# Patient Record
Sex: Male | Born: 1970 | Race: White | Hispanic: No | Marital: Married | Smoking: Current every day smoker
Health system: Southern US, Community
[De-identification: ages and names within clinical notes are randomized; demographics above are authoritative.]

## PROBLEM LIST (undated history)

## (undated) DIAGNOSIS — R569 Unspecified convulsions: Secondary | ICD-10-CM

## (undated) HISTORY — DX: Unspecified convulsions: R56.9

---

## 2018-03-30 ENCOUNTER — Emergency Department (HOSPITAL_COMMUNITY)
Admission: EM | Admit: 2018-03-30 | Discharge: 2018-03-31 | Disposition: A | Discharge status: Home / Self Care | Payer: Self-pay | Attending: Emergency Medicine | Admitting: Emergency Medicine

## 2018-03-30 ENCOUNTER — Other Ambulatory Visit: Payer: Self-pay

## 2018-03-30 DIAGNOSIS — Z79899 Other long term (current) drug therapy: Secondary | ICD-10-CM | POA: Insufficient documentation

## 2018-03-30 DIAGNOSIS — F1994 Other psychoactive substance use, unspecified with psychoactive substance-induced mood disorder: Secondary | ICD-10-CM | POA: Diagnosis present

## 2018-03-30 LAB — COMPREHENSIVE METABOLIC PANEL
ALBUMIN: 4.8 g/dL (ref 3.5–5.0)
ALT: 134 U/L — ABNORMAL HIGH (ref 17–63)
ANION GAP: 7 (ref 5–15)
AST: 97 U/L — ABNORMAL HIGH (ref 15–41)
Alkaline Phosphatase: 74 U/L (ref 38–126)
BUN: 13 mg/dL (ref 6–20)
CHLORIDE: 106 mmol/L (ref 101–111)
CO2: 25 mmol/L (ref 22–32)
Calcium: 9.5 mg/dL (ref 8.9–10.3)
Creatinine, Ser: 1.26 mg/dL — ABNORMAL HIGH (ref 0.61–1.24)
GFR calc Af Amer: >60 mL/min (ref >60)
GFR calc non Af Amer: >60 mL/min (ref >60)
GLUCOSE: 102 mg/dL — AB (ref 65–99)
Potassium: 4.7 mmol/L (ref 3.5–5.1)
SODIUM: 138 mmol/L (ref 135–145)
Total Bilirubin: 1 mg/dL (ref 0.3–1.2)
Total Protein: 8 g/dL (ref 6.5–8.1)

## 2018-03-30 LAB — CBC
HEMATOCRIT: 47 % (ref 39.0–52.0)
HEMOGLOBIN: 16.4 g/dL (ref 13.0–17.0)
MCH: 32.3 pg (ref 26.0–34.0)
MCHC: 34.9 g/dL (ref 30.0–36.0)
MCV: 92.7 fL (ref 78.0–100.0)
Platelets: 188 10*3/uL (ref 150–400)
RBC: 5.07 MIL/uL (ref 4.22–5.81)
RDW: 12.7 % (ref 11.5–15.5)
WBC: 8 10*3/uL (ref 4.0–10.5)

## 2018-03-30 LAB — ACETAMINOPHEN LEVEL

## 2018-03-30 LAB — ETHANOL: Alcohol, Ethyl (B): <10 mg/dL (ref <10)

## 2018-03-30 LAB — SALICYLATE LEVEL: Salicylate Lvl: <7 mg/dL (ref 2.8–30.0)

## 2018-03-30 LAB — RAPID URINE DRUG SCREEN, HOSP PERFORMED
Amphetamines: NOT DETECTED
BARBITURATES: NOT DETECTED
BENZODIAZEPINES: POSITIVE — AB
COCAINE: NOT DETECTED
Opiates: NOT DETECTED
TETRAHYDROCANNABINOL: POSITIVE — AB

## 2018-03-30 MED ORDER — ZIPRASIDONE MESYLATE 20 MG IM SOLR
10.0000 mg | Freq: Once | INTRAMUSCULAR | Status: DC
Start: 2018-03-30 — End: 2018-03-31

## 2018-03-30 MED ORDER — DIPHENHYDRAMINE HCL 50 MG/ML IJ SOLN
50.0000 mg | Freq: Once | INTRAMUSCULAR | Status: AC | PRN
  Administered 2018-03-31: 50 mg via INTRAMUSCULAR
  Filled 2018-03-30: qty 1

## 2018-03-30 MED ORDER — ZIPRASIDONE MESYLATE 20 MG IM SOLR
10.0000 mg | Freq: Once | INTRAMUSCULAR | Status: AC
  Administered 2018-03-30: 10 mg via INTRAMUSCULAR
  Filled 2018-03-30: qty 20

## 2018-03-30 MED ORDER — ZIPRASIDONE MESYLATE 20 MG IM SOLR
20.0000 mg | Freq: Once | INTRAMUSCULAR | Status: AC | PRN
  Administered 2018-03-31: 20 mg via INTRAMUSCULAR
  Filled 2018-03-30: qty 20

## 2018-03-30 NOTE — ED Provider Notes (Signed)
[facility]   CSN: [id number] Arrival date & time: 03/30/18  1245     History   Chief Complaint Chief Complaint  Patient presents with  . Suicidal    HPI Todd Lopez is a 47 y.o. male.  47 year old man presents under IVC by [facility] department after patient had some suicidal ideations.  Patient reportedly took 35 Xanax tablets but he admits only taken 3.  States that he took this for his anxiety and was not attempting to commit suicide.  He does report that he is more despondent due to his daughter dying of an overdose 8 months ago.  He denies any active ingestion of Tylenol or aspirin at this time.  No current use of alcohol products.  Patient has been very combative and is upset at law enforcement for bringing him here and he stated that he wants" to fuck them up".  Patient denies any formally diagnosed psychiatric problems.  No medication given prior to arrival.     No past medical history on file.  There are no active problems to display for this patient.         Home Medications    Prior to Admission medications   Not on File    Family History No family history on file.  Social History Social History   Tobacco Use  . Smoking status: Not on file  Substance Use Topics  . Alcohol use: Not on file  . Drug use: Not on file     Allergies   Patient has no allergy information on record.   Review of Systems Review of Systems  All other systems reviewed and are negative.    Physical Exam Updated Vital Signs BP (!) 191/157 (BP Location: Right Arm)   Pulse (!) 57   Temp 97.6 F (36.4 C) (Oral)   Resp 14   SpO2 100%   Physical Exam  Constitutional: He is oriented to person, place, and time. He appears well-developed and well-nourished.  Non-toxic appearance. No distress.  HENT:  Head: Normocephalic and atraumatic.  Eyes: Pupils are equal, round, and reactive to light. Conjunctivae, EOM and lids are  normal.  Neck: Normal range of motion. Neck supple. No tracheal deviation present. No thyroid mass present.  Cardiovascular: Normal rate, regular rhythm and normal heart sounds. Exam reveals no gallop.  No murmur heard. Pulmonary/Chest: Effort normal and breath sounds normal. No stridor. No respiratory distress. He has no decreased breath sounds. He has no wheezes. He has no rhonchi. He has no rales.  Abdominal: Soft. Normal appearance and bowel sounds are normal. He exhibits no distension. There is no tenderness. There is no rebound and no CVA tenderness.  Musculoskeletal: Normal range of motion. He exhibits no edema or tenderness.  Neurological: He is alert and oriented to person, place, and time. He has normal strength. No cranial nerve deficit or sensory deficit. GCS eye subscore is 4. GCS verbal subscore is 5. GCS motor subscore is 6.  Skin: Skin is warm and dry. No abrasion and no rash noted.  Psychiatric: His mood appears anxious. His speech is rapid and/or pressured. He is agitated, aggressive and hyperactive. He is not actively hallucinating. He expresses no suicidal plans and no homicidal plans.  Nursing note and vitals reviewed.    ED Treatments / Results  Labs (all labs ordered are listed, but only abnormal results are displayed) Labs Reviewed  COMPREHENSIVE METABOLIC PANEL  ETHANOL  SALICYLATE LEVEL  ACETAMINOPHEN LEVEL  CBC  RAPID URINE DRUG SCREEN, HOSP PERFORMED    EKG EKG Interpretation  Date/Time:  Thursday Mar 30 2018 13:48:57 EDT Ventricular Rate:  62 PR Interval:    QRS Duration: 108 QT Interval:  448 QTC Calculation: 455 R Axis:   62 Text Interpretation:  Sinus rhythm Borderline low voltage, extremity leads Confirmed by Lorre Nick ([id number]) on 03/30/2018 2:36:26 PM   Radiology No results found.  Procedures Procedures (including critical care time)  Medications Ordered in ED Medications  ziprasidone (GEODON) injection 10 mg (has no administration  in time range)     Initial Impression / Assessment and Plan / ED Course  I have reviewed the triage vital signs and the nursing notes.  Pertinent labs & imaging results that were available during my care of the patient were reviewed by me and considered in my medical decision making (see chart for details).     Patient required Geodon x2 for agitation.  He is more comfortable at this time.  He is not somnolent.  He is clear for psychiatric referral  Final Clinical Impressions(s) / ED Diagnoses   Final diagnoses:  None    ED Discharge Orders    None       Lorre Nick, MD 03/30/18 1440

## 2018-03-30 NOTE — ED Notes (Signed)
Bed: RESB Expected date:  Expected time:  Means of arrival:  Comments: [employer] SI xanax overdose 1240 ETA

## 2018-03-30 NOTE — BHH Counselor (Signed)
Clinician asked RN to inform her when the pt is roused/alert to engage in TTS assessment.    Redmond Pulling, MS, [facility], [facility] Triage Specialist [phone]
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
## 2018-03-30 NOTE — BHH Counselor (Signed)
Patient continues to be unable to assess. Patient was given Geodon earlier during the day. TTS writer attempted to assess patient but has been unable to due patient being sleep and unable to awaken. Patient will be assessed once patient is able to participate in the assessment.

## 2018-03-30 NOTE — ED Notes (Signed)
Patient pulled covers over his head upon arrival to unit and has been asleep since. He did respond when this writer attempted to speak with him. Regular, even, and unlabored respirations -- some snoring -- noted.

## 2018-03-30 NOTE — BHH Counselor (Signed)
Patient unable to be seen at this time. Patient was Geodon 2x 1308 and again at 1400.  TTS will attempt to see patient at a later time this day.

## 2018-03-30 NOTE — ED Notes (Signed)
Pt is screaming and pulling at his handcuffs, pt reports he is "refusing treatment and that is his right" RN attempted to explain to pt that he is IVC'd and we need an EKG to make sure his heart is functioning properly.  Pt screamed and cursed at staff and did not want them to do an EKG. RN attempting to reason with pt and pt allowed them to get an EKG at this time.

## 2018-03-31 ENCOUNTER — Encounter (HOSPITAL_COMMUNITY): Payer: Self-pay | Admitting: Emergency Medicine

## 2018-03-31 DIAGNOSIS — F1994 Other psychoactive substance use, unspecified with psychoactive substance-induced mood disorder: Secondary | ICD-10-CM | POA: Diagnosis present

## 2018-03-31 MED ORDER — STERILE WATER FOR INJECTION IJ SOLN
INTRAMUSCULAR | Status: AC
  Administered 2018-03-31: 10 mL
  Filled 2018-03-31: qty 10
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
## 2018-03-31 NOTE — ED Notes (Signed)
Pt had been angry because it was taking too long to discharge him in his opinion. Pt discharged home. Discharged instructions given to pt and he grabbed them out of this writer's hands without bothering to hear them discussed. Refused VS and refused to sign. All belongings returned to pt.  Continued to deny SI/HI, is not delusional and not responding to internal stimuli. Escorted pt to the ED exit.

## 2018-03-31 NOTE — Discharge Instructions (Signed)
For your behavioral health needs, you are advised to follow up with one of the following providers.  Contact them at your earliest opportunity to ask about enrolling in their program:       [facility]      [address]      [place], [place] [address]      [phone]      Provides psychiatry/medication management and therapy.  New patients are seen at their walk-in clinic.  Walk-in hours are Monday - Friday from 8:00 am - 12:00 pm, and from 1:00 pm - 3:00 pm.  Walk-in patients are seen on a first come, first served basis, so try to arrive as early as possible for the best chance of being seen the same day.  There is an initial fee of $22.50.       [facility]      [address]      [place], [place] [address]      [phone]      Provides psychiatry/medication management and group therapy.  New and returning patients are seen at their walk-in clinic.  Walk-in hours are Monday - Friday from 8:00 am - 3:00 pm.  Walk-in patients are seen on a first come, first served basis.  Try to arrive as early as possible for he best chance of being seen the same day.       [facility]      [address]      [place], [place] [address]      [phone]      Provides grief counseling.

## 2018-03-31 NOTE — BH Assessment (Signed)
BHH Assessment Progress Note Case was staffed with Sharma Covert DO who recommended IVC be rescinded and patient be discharged with OP resources.

## 2018-03-31 NOTE — ED Notes (Signed)
Pt woke up and immediately became agitated and angry, demanded to be let go, walked rapidly around the unit and sat down in front of the exit door and banged on it with his elbow. He would not de-escalate verbally and PRN IM medication was given. Pt initially refused, but with a show of support and additional conversation he allowed the shots to be given without hands on. He said that he sometimes get panic attacks with severe anxiety and increased heart rate because he lost his only daughter to an OD 8 months ago. He does not want to go inpatient. Pt denied any allergies to medication prior to administration of the medication.

## 2018-03-31 NOTE — BH Assessment (Signed)
BHH Assessment Progress Note This writer attempted to assess patient at 0800 hours unsuccessfully. Patient was noted to be agitated earlier and was sedated to assist with stabilization. Assess to be completed late this date.

## 2018-03-31 NOTE — BHH Counselor (Signed)
Per RN pt is still sleeping. TTS consult to be completed once pt is roused/alert.   Redmond Pullingreylese D Baudelio Karnes, MS, [facility], [facility] Triage Specialist [phone]

## 2018-03-31 NOTE — BHH Suicide Risk Assessment (Signed)
Suicide Risk Assessment  Discharge Assessment   [facility] Discharge Suicide Risk Assessment   Principal Problem: Substance induced mood disorder [facility]) Discharge Diagnoses:  Patient Active Problem List   Diagnosis Date Noted  . Substance induced mood disorder (HCC) [F19.94] 03/31/2018    Priority: High    Total Time spent with patient: 45 minutes  Musculoskeletal: Strength & Muscle Tone: within normal limits Gait & Station: normal Patient leans: N/A  Psychiatric Specialty Exam:   Blood pressure 110/67, pulse (!) 57, temperature 97.6 F (36.4 C), temperature source Oral, resp. rate 15, SpO2 100 %.There is no height or weight on file to calculate BMI.  General Appearance: Casual  Eye Contact::  Good  Speech:  Normal Rate409  Volume:  Normal  Mood:  Anxious, mild  Affect:  Congruent  Thought Process:  Coherent and Descriptions of Associations: Intact  Orientation:  Full (Time, Place, and Person)  Thought Content:  WDL and Logical  Suicidal Thoughts:  No  Homicidal Thoughts:  No  Memory:  Immediate;   Good Recent;   Good Remote;   Good  Judgement:  Fair  Insight:  Fair  Psychomotor Activity:  Normal  Concentration:  Good  Recall:  Good  Fund of Knowledge:Fair  Language: Good  Akathisia:  No  Handed:  Right  AIMS (if indicated):     Assets:  Housing Leisure Time Physical Health Resilience Social Support  Sleep:     Cognition: WNL  ADL's:  Intact   Mental Status Per Nursing Assessment::   On Admission:   47 yo male who presented to the ED with agitation after taking Xanax for anxiety.  Initially, they thought he took 35 tablets but he only took 3 Xanax to assist with his anxiety.  The Xanax was not his nor prescribed to him.  Recently moved to [facility], he has been upset about his daughter unintentionally overdosing 8 months ago and dying.  No suicidal/lhomicidal ideations, hallucinations, or withdrawal symptoms.  He was kept for over 24 hours and is no longer agitated.  His  wife came and has not safety concerns for him or anyone else.  Stable for discharge.  Demographic Factors:  Male and Caucasian  Loss Factors: NA  Historical Factors: NA  Risk Reduction Factors:   Sense of responsibility to family, Living with another person, especially a relative and Positive social support  Continued Clinical Symptoms:  Anxiety, mild  Cognitive Features That Contribute To Risk:  None    Suicide Risk:  Minimal: No identifiable suicidal ideation.  Patients presenting with no risk factors but with morbid ruminations; may be classified as minimal risk based on the severity of the depressive symptoms    Plan Of Care/Follow-up recommendations:  Activity:  as tolerated Diet:  heart healthy diet  Merland Holness, NP 03/31/2018, 3:37 PM

## 2018-03-31 NOTE — BH Assessment (Signed)
[facility]  Per Juanetta BeetsJacqueline Norman, DO, this pt does not require psychiatric hospitalization at this time.  Pt presents under IVC initiated by pt's wife, which Dr Sharma CovertNorman has rescinded.  [facility] with referral information for area providers of psychiatry and therapy.  Discharge instructions advise pt to follow up with [facility], with [facility], and [facility] [place].  Pt's nurse, Diane, has been notified.  Doylene Canninghomas Novelle Addair, MA Triage Specialist [phone]

## 2021-12-02 ENCOUNTER — Emergency Department (HOSPITAL_COMMUNITY): Payer: Self-pay

## 2021-12-02 ENCOUNTER — Emergency Department (HOSPITAL_COMMUNITY)
Admission: EM | Admit: 2021-12-02 | Discharge: 2021-12-02 | Disposition: A | Discharge status: Home / Self Care | Payer: Self-pay | Attending: Student | Admitting: Student

## 2021-12-02 ENCOUNTER — Encounter (HOSPITAL_COMMUNITY): Payer: Self-pay | Admitting: Emergency Medicine

## 2021-12-02 DIAGNOSIS — Z23 Encounter for immunization: Secondary | ICD-10-CM | POA: Insufficient documentation

## 2021-12-02 DIAGNOSIS — Z79899 Other long term (current) drug therapy: Secondary | ICD-10-CM | POA: Insufficient documentation

## 2021-12-02 DIAGNOSIS — W19XXXA Unspecified fall, initial encounter: Secondary | ICD-10-CM | POA: Insufficient documentation

## 2021-12-02 DIAGNOSIS — R569 Unspecified convulsions: Secondary | ICD-10-CM | POA: Insufficient documentation

## 2021-12-02 DIAGNOSIS — S0990XA Unspecified injury of head, initial encounter: Secondary | ICD-10-CM | POA: Insufficient documentation

## 2021-12-02 LAB — URINALYSIS, ROUTINE W REFLEX MICROSCOPIC
Bilirubin Urine: NEGATIVE
Glucose, UA: NEGATIVE mg/dL
Ketones, ur: NEGATIVE mg/dL
Leukocytes,Ua: NEGATIVE
Nitrite: NEGATIVE
Protein, ur: NEGATIVE mg/dL
Specific Gravity, Urine: >1.03 — ABNORMAL HIGH (ref 1.005–1.030)
pH: 5 (ref 5.0–8.0)

## 2021-12-02 LAB — CBC WITH DIFFERENTIAL/PLATELET
Abs Immature Granulocytes: 0.05 10*3/uL (ref 0.00–0.07)
Basophils Absolute: 0.1 10*3/uL (ref 0.0–0.1)
Basophils Relative: 0 %
Eosinophils Absolute: 0.1 10*3/uL (ref 0.0–0.5)
Eosinophils Relative: 1 %
HCT: 52 % (ref 39.0–52.0)
Hemoglobin: 17.9 g/dL — ABNORMAL HIGH (ref 13.0–17.0)
Immature Granulocytes: 0 %
Lymphocytes Relative: 17 %
Lymphs Abs: 1.9 10*3/uL (ref 0.7–4.0)
MCH: 32.4 pg (ref 26.0–34.0)
MCHC: 34.4 g/dL (ref 30.0–36.0)
MCV: 94.2 fL (ref 80.0–100.0)
Monocytes Absolute: 0.4 10*3/uL (ref 0.1–1.0)
Monocytes Relative: 3 %
Neutro Abs: 9 10*3/uL — ABNORMAL HIGH (ref 1.7–7.7)
Neutrophils Relative %: 79 %
Platelets: 235 10*3/uL (ref 150–400)
RBC: 5.52 MIL/uL (ref 4.22–5.81)
RDW: 11.9 % (ref 11.5–15.5)
WBC: 11.5 10*3/uL — ABNORMAL HIGH (ref 4.0–10.5)
nRBC: 0 % (ref 0.0–0.2)

## 2021-12-02 LAB — COMPREHENSIVE METABOLIC PANEL
ALT: 41 U/L (ref 0–44)
AST: 38 U/L (ref 15–41)
Albumin: 4.1 g/dL (ref 3.5–5.0)
Alkaline Phosphatase: 56 U/L (ref 38–126)
Anion gap: 9 (ref 5–15)
BUN: 13 mg/dL (ref 6–20)
CO2: 21 mmol/L — ABNORMAL LOW (ref 22–32)
Calcium: 9.3 mg/dL (ref 8.9–10.3)
Chloride: 108 mmol/L (ref 98–111)
Creatinine, Ser: 1.19 mg/dL (ref 0.61–1.24)
GFR, Estimated: >60 mL/min (ref >60)
Glucose, Bld: 108 mg/dL — ABNORMAL HIGH (ref 70–99)
Potassium: 4.3 mmol/L (ref 3.5–5.1)
Sodium: 138 mmol/L (ref 135–145)
Total Bilirubin: 1.2 mg/dL (ref 0.3–1.2)
Total Protein: 7.1 g/dL (ref 6.5–8.1)

## 2021-12-02 LAB — RAPID URINE DRUG SCREEN, HOSP PERFORMED
Amphetamines: NOT DETECTED
Barbiturates: NOT DETECTED
Benzodiazepines: NOT DETECTED
Cocaine: NOT DETECTED
Opiates: NOT DETECTED
Tetrahydrocannabinol: POSITIVE — AB

## 2021-12-02 LAB — URINALYSIS, MICROSCOPIC (REFLEX)

## 2021-12-02 LAB — ETHANOL: Alcohol, Ethyl (B): <10 mg/dL (ref <10)

## 2021-12-02 LAB — TSH: TSH: 2.55 u[IU]/mL (ref 0.350–4.500)

## 2021-12-02 MED ORDER — TETANUS-DIPHTH-ACELL PERTUSSIS 5-2.5-18.5 LF-MCG/0.5 IM SUSY
0.5000 mL | PREFILLED_SYRINGE | Freq: Once | INTRAMUSCULAR | Status: AC
  Administered 2021-12-02: 0.5 mL via INTRAMUSCULAR
  Filled 2021-12-02: qty 0.5

## 2021-12-02 MED ORDER — LEVETIRACETAM IN NACL 1000 MG/100ML IV SOLN
1000.0000 mg | Freq: Once | INTRAVENOUS | Status: AC
  Administered 2021-12-02: 1000 mg via INTRAVENOUS
  Filled 2021-12-02: qty 100

## 2021-12-02 MED ORDER — KETOROLAC TROMETHAMINE 30 MG/ML IJ SOLN
15.0000 mg | Freq: Once | INTRAMUSCULAR | Status: AC
  Administered 2021-12-02: 15 mg via INTRAVENOUS
  Filled 2021-12-02: qty 1

## 2021-12-02 MED ORDER — LEVETIRACETAM 750 MG PO TABS: 750.0000 mg | ORAL_TABLET | Freq: Two times a day (BID) | ORAL | 0 refills | Status: DC

## 2021-12-02 NOTE — ED Triage Notes (Signed)
Pt. Stated, Ive had hot flashes for years, today I woke up to get ready and tharts the last thing I remembered.  New onset seizure witnessed by wife .

## 2021-12-02 NOTE — ED Provider Triage Note (Signed)
Emergency Medicine Provider Triage Evaluation Note  Azir Muzyka , a 51 y.o. male  was evaluated in triage.  Pt complains of supposed seizure this morning around 630 to 7 AM.  Patient states he woke up, was in his usual state of health when he became very hot and the next and he remembers he was on the ground with his wife over top of him.  Patient denies any past seizure history, denies alcohol use.  Patient states he had period of confusion upon waking up, denies bowel or bladder incontinence.  Review of Systems  Positive: Weakness, loss of consciousness Negative: Chest pain, shortness of breath, nausea, vomiting  Physical Exam  BP 105/76 (BP Location: Left Arm)    Pulse (!) 50    Temp (!) 97.4 F (36.3 C) (Oral)    Resp 16    SpO2 96%  Gen:   Awake, no distress   Resp:  Normal effort  MSK:   Moves extremities without difficulty  Other:  Neuro exam shows no focal deficits.  Medical Decision Making  Medically screening exam initiated at 10:18 AM.  Appropriate orders placed.  Elver Stadler was informed that the remainder of the evaluation will be completed by another provider, this initial triage assessment does not replace that evaluation, and the importance of remaining in the ED until their evaluation is complete.     Al Decant, PA-C 12/02/21 1019
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
## 2021-12-02 NOTE — ED Provider Notes (Signed)
[facility] Provider [facility]   CSN: [id number] Arrival date & time: 12/02/21  [id number]     History  Chief Complaint  Patient presents with   Seizures   Head Injury   Fall    [address]Jdyn Marcene CorningJoseph Yaden is a 51 y.o. male.  51 y.o. male with a PMH of anxiety who presents to the ED from home via EMS for new onset 2-3 minute seizure with loss of consciousness, fall, head injury, and postictal period of 15 minutes. The patient's wife is present and acts as a historian for part of the history. This morning he awoke with an episode of "hot flashes" (feels SHOB, pale, sweaty) and walked to the bathroom to urinate. His wife heard him scream and heard a fall after which she found him seizing with eyes closed and muscles clenched.   The patient has a history of several years of "hot flashes" that occur intermittently and increase in frequency with increased stress level. The episodes last approximately 2-3 minutes with associated dyspnea, lightheadedness, nausea, vomiting, pale skin, and sweating. Afterwards, he feels confused and is unable to know himself or where he is for 30 seconds - 1 minute. He has never been treated for this before and attributes his symptoms to panic attacks. He states he last received treatment for anxiety 20 years ago and was temporarily on Xanax. He denies taking any medications aside from ibuprofen every other day for muscle aches from exercise. He uses meditation, exercise, and marijuana daily to manage stress. He denies alcohol use or other illicit drug use. He has a family history of epilepsy in his mother. He complains of soreness in all of his muscles, particularly his back, shoulders, ribs, and legs as well as a headache. He denies current CP, SOB, abdominal pain, or nausea.       Home Medications Prior to Admission medications   Medication Sig Start Date End Date Taking? Authorizing Provider  acetaminophen (TYLENOL) 500 MG tablet Take 1,000  mg by mouth every 6 (six) hours as needed for headache.   Yes [provider]  ibuprofen (ADVIL) 200 MG tablet Take 400-600 mg by mouth every 6 (six) hours as needed.   Yes [provider]  levETIRAcetam (KEPPRA) 750 MG tablet Take 1 tablet (750 mg total) by mouth 2 (two) times daily. 12/02/21 01/01/22 Yes Jeannie FendMurphy, Essie Lagunes A, PA-C      Allergies    Patient has no known allergies.    Review of Systems   Review of Systems  Constitutional:  Negative for fever.  Respiratory:  Negative for shortness of breath.   Gastrointestinal:  Negative for abdominal pain, constipation, diarrhea, nausea and vomiting.  Genitourinary:  Negative for dysuria.  Musculoskeletal:  Positive for myalgias.  Skin:  Positive for wound.  Allergic/Immunologic: Negative for immunocompromised state.  Neurological:  Positive for seizures and headaches.  Psychiatric/Behavioral:  Positive for confusion.    Physical Exam Updated Vital Signs BP 113/84    Pulse (!) 52    Temp (!) 97.4 F (36.3 C) (Oral)    Resp 11    SpO2 98%  Physical Exam Vitals and nursing note reviewed.  Constitutional:      Appearance: Normal appearance.  HENT:     Head: Normocephalic.      Comments: Right periorbital contusion, extraocular is intact, no crepitus    Nose: Nose normal.     Mouth/Throat:     Mouth: Mucous membranes are moist.  Eyes:  Extraocular Movements: Extraocular movements intact.     Pupils: Pupils are equal, round, and reactive to light.  Cardiovascular:     Rate and Rhythm: Normal rate and regular rhythm.     Heart sounds: Normal heart sounds.  Pulmonary:     Effort: Pulmonary effort is normal.     Breath sounds: Normal breath sounds.  Abdominal:     Palpations: Abdomen is soft.     Tenderness: There is no abdominal tenderness.  Musculoskeletal:        General: No swelling or deformity.     Cervical back: Normal range of motion and neck supple. Tenderness present. No bony tenderness.       Back:      Right lower leg: No edema.     Left lower leg: No edema.  Skin:    General: Skin is warm and dry.     Findings: No erythema or rash.  Neurological:     Mental Status: He is alert.     Sensory: No sensory deficit.     Motor: No weakness.     Comments: Reports date as 11/02/2019  Psychiatric:        Behavior: Behavior normal.    ED Results / Procedures / Treatments   Labs (all labs ordered are listed, but only abnormal results are displayed) Labs Reviewed  COMPREHENSIVE METABOLIC PANEL - Abnormal; Notable for the following components:      Result Value   CO2 21 (*)    Glucose, Bld 108 (*)    All other components within normal limits  CBC WITH DIFFERENTIAL/PLATELET - Abnormal; Notable for the following components:   WBC 11.5 (*)    Hemoglobin 17.9 (*)    Neutro Abs 9.0 (*)    All other components within normal limits  RAPID URINE DRUG SCREEN, HOSP PERFORMED - Abnormal; Notable for the following components:   Tetrahydrocannabinol POSITIVE (*)    All other components within normal limits  URINALYSIS, ROUTINE W REFLEX MICROSCOPIC - Abnormal; Notable for the following components:   APPearance HAZY (*)    Specific Gravity, Urine >1.030 (*)    Hgb urine dipstick TRACE (*)    All other components within normal limits  URINALYSIS, MICROSCOPIC (REFLEX) - Abnormal; Notable for the following components:   Bacteria, UA RARE (*)    All other components within normal limits  TSH  ETHANOL  CBG MONITORING, ED    EKG None  Radiology CT Head Wo Contrast  Result Date: 12/02/2021 CLINICAL DATA:  Trauma EXAM: CT HEAD WITHOUT CONTRAST CT CERVICAL SPINE WITHOUT CONTRAST TECHNIQUE: Multidetector CT imaging of the head and cervical spine was performed following the standard protocol without intravenous contrast. Multiplanar CT image reconstructions of the cervical spine were also generated. RADIATION DOSE REDUCTION: This exam was performed according to the departmental dose-optimization program  which includes automated exposure control, adjustment of the mA and/or kV according to patient size and/or use of iterative reconstruction technique. COMPARISON:  None. FINDINGS: CT HEAD FINDINGS Brain: No evidence of acute infarction, hemorrhage, hydrocephalus, extra-axial collection or mass lesion/mass effect. Vascular: No hyperdense vessel or unexpected calcification. Skull: Normal. Negative for fracture or focal lesion. Sinuses/Orbits: No acute finding. Other: None. CT CERVICAL SPINE FINDINGS Alignment: Normal. Skull base and vertebrae: No acute fracture. No primary bone lesion or focal pathologic process. Soft tissues and spinal canal: No prevertebral fluid or swelling. No visible canal hematoma. Disc levels:  No significant degenerative changes. Upper chest: Negative. Other: None. IMPRESSION: 1. No acute  intracranial abnormality. 2. No evidence of acute cervical spine fracture or traumatic malalignment. Electronically Signed   By: Allegra Lai M.D.   On: 12/02/2021 12:35   CT Cervical Spine Wo Contrast  Result Date: 12/02/2021 CLINICAL DATA:  Trauma EXAM: CT HEAD WITHOUT CONTRAST CT CERVICAL SPINE WITHOUT CONTRAST TECHNIQUE: Multidetector CT imaging of the head and cervical spine was performed following the standard protocol without intravenous contrast. Multiplanar CT image reconstructions of the cervical spine were also generated. RADIATION DOSE REDUCTION: This exam was performed according to the departmental dose-optimization program which includes automated exposure control, adjustment of the mA and/or kV according to patient size and/or use of iterative reconstruction technique. COMPARISON:  None. FINDINGS: CT HEAD FINDINGS Brain: No evidence of acute infarction, hemorrhage, hydrocephalus, extra-axial collection or mass lesion/mass effect. Vascular: No hyperdense vessel or unexpected calcification. Skull: Normal. Negative for fracture or focal lesion. Sinuses/Orbits: No acute finding. Other: None.  CT CERVICAL SPINE FINDINGS Alignment: Normal. Skull base and vertebrae: No acute fracture. No primary bone lesion or focal pathologic process. Soft tissues and spinal canal: No prevertebral fluid or swelling. No visible canal hematoma. Disc levels:  No significant degenerative changes. Upper chest: Negative. Other: None. IMPRESSION: 1. No acute intracranial abnormality. 2. No evidence of acute cervical spine fracture or traumatic malalignment. Electronically Signed   By: Allegra Lai M.D.   On: 12/02/2021 12:35    Procedures Procedures    Medications Ordered in ED Medications  Tdap (BOOSTRIX) injection 0.5 mL (has no administration in time range)  levETIRAcetam (KEPPRA) IVPB 1000 mg/100 mL premix (has no administration in time range)  ketorolac (TORADOL) 30 MG/ML injection 15 mg (has no administration in time range)    ED Course/ Medical Decision Making/ A&P                           Medical Decision Making Amount and/or Complexity of Data Reviewed Labs: ordered. Radiology: ordered.  Risk Prescription drug management.   This patient presents to the ED for concern of seizure-like activity today, this involves an extensive number of treatment options, and is a complaint that carries with it a high risk of complications and morbidity.  The differential diagnosis includes but not limited to electrolyte disturbance, withdrawal, brain mass, brain injury   Co morbidities that complicate the patient evaluation  Sub induced mood disorder   Additional history obtained:  Additional history obtained from wife at bedside who reports whole body shaking episode followed by period of confusion External records from outside source obtained and reviewed including prior record on file from 2019 related to Xanax use, denies use at this time   Lab Tests:  I Ordered, and personally interpreted labs.  The pertinent results include: UTI positive for marijuana, TSH normal, CBC with nonspecific  leukocytosis at 11.5.  CMP without significant electrolyte derangement, glucose 108.  Urinalysis negative for infection.   Imaging Studies ordered:  I ordered imaging studies including CT head and C-spine I independently visualized and interpreted imaging which showed no acute intracranial abnormality, no C-spine fracture I agree with the radiologist interpretation   Cardiac Monitoring:  The patient was maintained on a cardiac monitor.  I personally viewed and interpreted the cardiac monitored which showed an underlying rhythm of: Sinus bradycardia   Medicines ordered and prescription drug management:  I ordered medication including Keppra load for seizure-like episode Reevaluation of the patient after these medicines showed that the patient  no seizure activity while  in the department, discharged load given. I have reviewed the patients home medicines and have made adjustments as needed   Test Considered:  EEG, can be completed outpatient  Consultations Obtained:  I requested consultation with the neuro hospitalist, Dr. Thomasena Edis,  and discussed lab and imaging findings as well as pertinent plan - they recommend: Keppra load and discharged on Keppra 750 mg twice daily, follow-up with neurology outpatient   Problem List / ED Course:  51 year old male brought in by EMS from home with seizure-like activity as witnessed by wife as above.  Upon arrival in the emergency room, patient was a little disoriented as to the date, this is improved with time.  He is found to have mild right periorbital ecchymosis without crepitus, extraocular is intact.  CT of the head and C-spine are negative for acute injury or findings to explain his event today.  Does report marijuana use, discussed this can lower seizure threshold.  Electrolytes otherwise normal and work-up is unremarkable. Discussed with Dr. Thomasena Edis with neurology, discussed hot flash episodes as well as today's witnessed event, reports this  could be left temporal lobe related and recommends loading with Keppra and discharged with same with neuro follow-up   Reevaluation:  After the interventions noted above, I reevaluated the patient and found that they have :improved  Dispostion:  After consideration of the diagnostic results and the patients response to treatment, I feel that the patent would benefit from initiation of Keppra, follow-up with neurology.  Given return to ER precautions, advised not to drive, work from heights or soak in a tub/pool until further evaluation and clearance with neurology.          Final Clinical Impression(s) / ED Diagnoses Final diagnoses:  Seizure [facility])    Rx / DC Orders ED Discharge Orders          Ordered    levETIRAcetam (KEPPRA) 750 MG tablet  2 times daily        12/02/21 1309              Alden Hipp 12/02/21 1337    Glendora Score, MD 12/02/21 [phone]

## 2021-12-02 NOTE — ED Triage Notes (Signed)
EMS stated, wife stated he had a new seizure onset , pt fell and hit his head. He has had hot flashes before it happened. Pt was postictal for 15 min.

## 2021-12-02 NOTE — Discharge Instructions (Signed)
Take Keppra as prescribed. Follow-up with neurology, referral given. Return to ER for new or worsening symptoms  Do not drive, operate machinery, work from a ladder or height, soak in a tub or some in a pool without direct observation, until cleared by neurology.

## 2021-12-03 ENCOUNTER — Encounter: Payer: Self-pay | Admitting: Neurology

## 2021-12-11 ENCOUNTER — Ambulatory Visit (INDEPENDENT_AMBULATORY_CARE_PROVIDER_SITE_OTHER): Payer: Self-pay | Admitting: Neurology

## 2021-12-11 ENCOUNTER — Encounter: Payer: Self-pay | Admitting: Neurology

## 2021-12-11 ENCOUNTER — Other Ambulatory Visit: Payer: Self-pay

## 2021-12-11 VITALS — BP 115/77 | HR 74 | Resp 18 | Ht 70.0 in | Wt 206.0 lb

## 2021-12-11 DIAGNOSIS — R569 Unspecified convulsions: Secondary | ICD-10-CM
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
## 2021-12-14 ENCOUNTER — Other Ambulatory Visit: Payer: Self-pay

## 2021-12-14 ENCOUNTER — Ambulatory Visit (INDEPENDENT_AMBULATORY_CARE_PROVIDER_SITE_OTHER): Payer: Self-pay | Admitting: Neurology

## 2021-12-14 DIAGNOSIS — R569 Unspecified convulsions: Secondary | ICD-10-CM

## 2021-12-14 MED ORDER — LAMOTRIGINE 100 MG PO TABS: 100.0000 mg | ORAL_TABLET | Freq: Two times a day (BID) | ORAL | 4 refills | Status: DC

## 2021-12-14 MED ORDER — LAMOTRIGINE 25 MG PO TABS: ORAL_TABLET | ORAL | 0 refills | Status: DC

## 2021-12-15 NOTE — Procedures (Signed)
ELECTROENCEPHALOGRAM REPORT  Date of Study: 12/14/2021  Patient's Name: Todd Lopez MRN: [id number] Date of Birth: 1971-07-24  Referring Provider: Dr. Patrcia Dolly  Clinical History: This is a 51 year old man with new onset seizure, with recurrent episodes of "hot flashes" with associated confusion. EEG for classification.  Medications: KEPPRA 750 MG tablet TYLENOL 500 MG tablet ADVIL 200 MG tablet   Technical Summary: A multichannel digital 1-hour EEG recording measured by the international 10-20 system with electrodes applied with paste and impedances below 5000 ohms performed in our laboratory with EKG monitoring in an awake and asleep patient.  Hyperventilation was not performed. Photic stimulation was performed.  The digital EEG was referentially recorded, reformatted, and digitally filtered in a variety of bipolar and referential montages for optimal display.    Description: The patient is awake and asleep during the recording.  During maximal wakefulness, there is a symmetric, medium voltage 9 Hz posterior dominant rhythm that attenuates with eye opening.  There is occasional focal 4-5 Hz theta slowing over the right frontotemporal region. During drowsiness and sleep, there is an increase in theta slowing of the background.  Vertex waves and symmetric sleep spindles were seen.  Photic stimulation did not elicit any abnormalities.  There were no epileptiform discharges or electrographic seizures seen.    EKG lead showed sinus bradycardia at 48 bpm.  Impression: This 1-hour awake and asleep EEG is abnormal due to occasional focal slowing over the right frontotemporal region.  Clinical Correlation of the above findings indicates focal cerebral dysfunction over the right frontotemporal region suggestive of underlying structural or physiologic abnormality. The absence of epileptiform discharges does not exclude a clinical diagnosis of epilepsy. Clinical correlation is  advised.   Patrcia Dolly, M.D.

## 2022-01-03 ENCOUNTER — Other Ambulatory Visit: Payer: Self-pay | Admitting: Neurology

## 2022-01-03 ENCOUNTER — Other Ambulatory Visit: Payer: Self-pay

## 2022-01-03 ENCOUNTER — Ambulatory Visit
Admission: RE | Admit: 2022-01-03 | Discharge: 2022-01-03 | Disposition: A | Discharge status: Home / Self Care | Payer: No Typology Code available for payment source | Source: Ambulatory Visit | Attending: Neurology | Admitting: Neurology

## 2022-01-03 DIAGNOSIS — R569 Unspecified convulsions: Secondary | ICD-10-CM

## 2022-01-03 MED ORDER — GADOBENATE DIMEGLUMINE 529 MG/ML IV SOLN: 19.0000 mL | Freq: Once | INTRAVENOUS | Status: DC | PRN

## 2022-01-05 ENCOUNTER — Telehealth: Payer: Self-pay | Admitting: Neurology

## 2022-01-05 NOTE — Telephone Encounter (Signed)
Left VM to discuss test results.

## 2022-02-10 ENCOUNTER — Telehealth: Payer: Self-pay | Admitting: Neurology

## 2022-02-10 NOTE — Telephone Encounter (Signed)
Pt's wife called in stating the seizure medication is working well, but he is still having panic attacks. He is having a little more family stress in his life. He had a panic attack in a store today. They are aware Dr. Delice Lesch is out until Monday. Requests a call to the patient.  ?

## 2022-02-10 NOTE — Telephone Encounter (Signed)
Call would not go thru at 3:52  ?

## 2022-02-11 NOTE — Telephone Encounter (Signed)
Mychart message sent.

## 2022-03-08 ENCOUNTER — Encounter: Payer: Self-pay | Admitting: Neurology

## 2022-03-08 ENCOUNTER — Telehealth: Payer: Self-pay | Admitting: Neurology

## 2022-03-08 ENCOUNTER — Ambulatory Visit (INDEPENDENT_AMBULATORY_CARE_PROVIDER_SITE_OTHER): Payer: Self-pay | Admitting: Neurology

## 2022-03-08 VITALS — BP 115/73 | HR 64 | Ht 71.0 in | Wt 204.6 lb

## 2022-03-08 DIAGNOSIS — R569 Unspecified convulsions: Secondary | ICD-10-CM

## 2022-03-08 NOTE — Telephone Encounter (Signed)
Patients friend Lowella Bandy called and stated they need the notes from Joes last appt faxed to [facility], [place].  Fax # [phone]. ?
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
## 2022-03-09 NOTE — Telephone Encounter (Signed)
Office notes faxed to [facility]  ?

## 2022-05-10 ENCOUNTER — Ambulatory Visit (INDEPENDENT_AMBULATORY_CARE_PROVIDER_SITE_OTHER): Payer: Self-pay | Admitting: Neurology

## 2022-05-10 ENCOUNTER — Encounter: Payer: Self-pay | Admitting: Neurology

## 2022-05-10 VITALS — BP 108/72 | HR 65 | Ht 70.0 in | Wt 207.0 lb

## 2022-05-10 DIAGNOSIS — R569 Unspecified convulsions: Secondary | ICD-10-CM

## 2022-05-10 DIAGNOSIS — F410 Panic disorder [episodic paroxysmal anxiety] without agoraphobia: Secondary | ICD-10-CM
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
## 2022-05-21 ENCOUNTER — Ambulatory Visit (INDEPENDENT_AMBULATORY_CARE_PROVIDER_SITE_OTHER): Payer: Self-pay | Admitting: Neurology

## 2022-05-21 DIAGNOSIS — F410 Panic disorder [episodic paroxysmal anxiety] without agoraphobia: Secondary | ICD-10-CM

## 2022-05-21 DIAGNOSIS — R569 Unspecified convulsions: Secondary | ICD-10-CM

## 2022-05-24 ENCOUNTER — Telehealth: Payer: Self-pay | Admitting: Neurology

## 2022-05-24 NOTE — Telephone Encounter (Signed)
Spoke with pt wife informed her that Dr Karel Jarvis had not yet received the EEG study for review. In the meantime, Dr Karel Jarvis  would like to increase his Lamotrigine to 200mg  BID.increase to 2 tabs BID. If he is running out of 100mg  tablets, pls let him know we are sending in a prescription for higher dose 200mg : take 1 tab BID. Pt wife will call when they are getting low on 100mg  tablets

## 2022-05-24 NOTE — Telephone Encounter (Signed)
Patients wife called and stated Todd Lopez has the cap on all weekend.  He had a grand mall seizure yesterday morning.  He was taking his seizure med the whole time.  She wanted to let Dr know and she if she wants to up his medication. He is getting the cap off today and didn't know if she needs to schedule a follow up.

## 2022-05-24 NOTE — Telephone Encounter (Signed)
I have not yet received the EEG study for review. In the meantime, yes I would like to increase his Lamotrigine to 200mg  BID. Does he still have a lot of the 100mg  tabs? If yes, increase to 2 tabs BID. If he is running out of 100mg  tablets, pls let him know we are sending in a prescription for higher dose 200mg : take 1 tab BID. Pls send Rx if needed. Thanks

## 2022-05-24 NOTE — Telephone Encounter (Signed)
Pt c/o: seizure Missed medications?  No. Sleep deprived?  Yes.   Hard to sleep with eeg monitor on  Alcohol intake?  No. Increased stress? No. Any change in medication color or shape? No. Any triggers? No trigger,  Back to their usual baseline self?  Yes.  . If no, advise go to ER Current medications prescribed by Dr. Karel Jarvis: lamotrigine  1 tablet (100 mg total) by mouth 2 (two) times daily  Stopped the xanax in the morning, to see how he was without it, Friday with out it a few episodes sat had a 6 episodes and Sunday was the big one he was going to go to the bathroom but he went back on the bed kicked everything over, seizure last 2 to 3 mins confusion was about 10 mins.   He is no longer taken Prozac he is taken 20 mg citalopram

## 2022-05-27 ENCOUNTER — Telehealth: Payer: Self-pay | Admitting: Neurology

## 2022-05-27 NOTE — Telephone Encounter (Signed)
Spoke to patient and the to his wife Joni Reining about aEEG results. Discussed that the events where he thought of the past with palpitations, SOB did show EEG changes with right temporal seizures seen. His nocturnal seizure appears to have a left temporal onset. Continue Lamotrigine 200mg  BID, she will let know when he needs a refill for 200mg  tab to take 1 tablet BID. Discussed that Xanax should not be taken on a daily basis, and if he would need it daily, would recommend Clobazam instead as a daily seizure preventative medication. He then reported that he is not needing the Xanax daily now, he takes it once every other day, he would like something as a rescue when he has a meeting or something stressful. Discussed that as long as he is not taking Xanax daily due to potential for dependence and tolerance, which they understand. He is also on citalopram now which has helped with the agitation, it has gone away.

## 2022-06-02 NOTE — Procedures (Signed)
Patient's Name: Todd Lopez MRN: [id number] Date of Birth: [birth year]   Ordering Provider: Patrcia Dolly, M.D. DX CODE(s): R56.9 EXAM DURATION: 69 hours   CLINICAL HISTORY: This is a 51 year old man with stereotyped episodes of feeling anxious, warm, palpitations, that he would be amnestic of. One of these progressed to a convulsion in February 2023. He continues to report recurrent anxiety/panic attacks. EEG for classification.    MEDICATION(s): Lamotrigine, Citalopram, Alprazolam (held during study)   TECHNICAL DESCRIPTION: Long-Term EEG with Video was monitored intermittently by a qualified EEG technologist for the entirety of the recording; quality check-ins were performed at a minimum of every two hours, checking, and documenting real-time data and video to assure the integrity and quality of the recording (e.g., camera position, electrode integrity and impedance), and identify the need for maintenance. For intermittent monitoring, an EEG Technologist monitored no more than 12 patients concurrently. Video was being recorded at least 80% of the time during the study duration, unless otherwise noted in an Exception Statement.   At the end of the recording, the EEG Technologist generates a technical description, which is the EEG Technologist's written documentation of the reviewed video-EEG data, including technical interventions and these elements: reviewing raw EEG/VEEG data and events and automated detection as well as patient pushbutton event activations; and annotating, editing, and archiving EEG/VEEG data for review by the physician or other qualified healthcare professional. For review, the Video EEG recording can be visualized in all standard types of montages, 16 channels and greater, and playbacks include digital high frequency filters previously noted. The Video EEG has been notated with patient typical symptom events at the direction of the patient by depressing a push button mounted on a  waist worn [employer] EEG recording device. Digital spike and seizure detection software was used to identify potential abnormalities in the EEG, and alerts were reviewed and annotated by the technologist in the [employer] EEG Review software. Video EEG and report are notated with events that were determined to be of significance by the digital analysis software showing spike and seizure detections.   SET-UP TECH: Eleanora Neighbor   RECORDING SET-UP DATE: 05/21/2022 11:35AM RECORDING TAKE-DOWN DATE: 05/24/2022 9:07AM     SPIKE AND SEIZURE ANALYSIS AND REVIEW: Spike and seizure detection software alerts have been reviewed by a Registered EEG Technologist.   PUSH BUTTON EVENTS: A push button or notation was made 8 times. Patient log was reviewed with the patient at disconnect with the intent to reconcile events. 2 button presses were accidental, or monitor tech driven (Resets)   DESCRIPTION OF RECORDING: During maximal wakefulness, the background activity consisted of a symmetric 10 Hz posterior dominant rhythm that was reactive to eye opening and eye closure. There is occasional runs of focal 4-5 Hz theta slowing over the right frontotemporal region, at time sharply contoured. There were no epileptiform discharges seen.   During the recording, the patient progresses through wakefulness, drowsiness, and sleep. Vertex waves and sleep spindles were seen. Similar occasional focal right frontotemporal theta slowing is seen. Again there were no epileptiform discharges seen.   EKG lead was normal.    PUSH BUTTON EVENTS: On 7/21 at 1818 alert is pressed 1 time. He was watching [employer] on TV and started thinking about the past, fast thought, had weird small, fast heartbeat, hard to breathe. He is sitting on the couch and seen taking a few heavy breaths. Electrographically at 1817 hours, rhythmic 4-5 Hz activity is seen over the right frontotemporal region evolving  in frequency and amplitude, lasting 22  seconds. After electrographic seizure ends, he looks to his wife and motions with his right hand, she stands and pushes event button.  On 7/21 at 2118 hours, no alert pressed. He lists a small one on diary, it made him start yawning, heart beating hard, head throbbing, dizzy for 30 seconds while watching trauma on TV that brought up emotions. He is sitting on couch staring straight ahead. Electrographically, rhythmic 4-5 Hz theta activity is seen over the right frontotemporal region evolving in frequency and amplitude, lasting 15 seconds.  On 7/22 at 0828 hours, no alert pressed. Diary entry lists a small quick one. He is sitting on the couch, closes eyes and lays head back. Electrographically, there were no EEG or EKG changes seen.  On 7/22 at 1122 hours, alert pressed 1 time, listed in diary as thought about neighbor mowing their yard. He is standing in the kitchen, walks out of range, no clinical changes seen. Electrographically, at 1120 hours, rhythmic 4-5 Hz theta activity is seen over the right frontotemporal region evolving in frequency and amplitude, lasting 20 seconds. He pushes the event button around 45 seconds after seizure cessation.  On 7/22 at 1411 hours, no alert pressed. Diary entry listed a small episode about 2 minutes. Patient sitting on couch watching TV, no clinical changes seen. Electrographically, there were no EEG or EKG changes seen.  On 7/22 at 1428 hours, no alert pressed. He listed a small one. He is sitting on couch, no clinical changes seen. Electrographically, there were no EEG or EKG changes seen.   On 7/23 at 0547 hours, no alert is pressed. He is asleep and arouses, turns to his left side with back to camera. He moans several times and says "oh God. This lasts for 2 minutes and 30 seconds then he goes back to sleep. Electrographically, sleep architecture is seen followed by arousal at 0547 with muscle artifact obscuring EEG for 23 seconds, then rhythmic 4-5 Hz theta  activity is seen over the left temporal region with spread to the right frontal and vertex regions evolving in frequency for 75 seconds followed by delta slowing over the left temporal region.  On 7/23 at 0658 hours, no alert pressed. Diary entry listed at 0717 hours indicates grand mal seizure lasting 3-5 minutes, he was confused for 20 minutes after. He is sitting on the bed and moans like he is trying to catch his breath. He touches his chin with his left hand, head turns to the right with right leg extended, then clonic movement of the right arm and leg (laying on left side). Electrographically, there is rhythmic 3-4 Hz activity over the left anterior temporal region intermixed with spike discharges in the left hemisphere and bilateral anterior spike and wave discharges. EEG then becomes obscured by muscle artifact for 2 minutes. This is followed by delta slowing over the left temporal region.  On 7/23 at 1016 hours, alert pressed 1 time. Diary entry listed as feeling like he is falling with heart pounding fast for 5 minutes. He is sitting on the couch with labored breathing, says "Oh God," lays back on couch. Electrographically, rhythmic 6-7 Hz activity is seen over the left hemisphere for 15 seconds then spreads to the right hemisphere for 20 seconds before return to baseline.  On 7/23 at 1027 hours, alert pressed 1 time. Diary entry listed as sweating real bad with this smaller one, has to pee. He is not on video. Electrographically, rhythmic 6-7  Hz theta activity is seen over the left hemisphere for 20 seconds then spreads to the right hemisphere for another 20 seconds before return to baseline.    IMPRESSION: This 69-hour ambulatory video EEG is abnormal due to the presence of: Occasional focal slowing over the right temporal region There were 3 focal seizures as described above arising from the right temporal region There is 4 focal seizures that appeared to arise from the left temporal region,  one of these progressed to a generalized tonic-clonic seizure.     CLINICAL CORRELATION of the above findings indicates focal cerebral dysfunction over the right temporal region suggestive of underlying structural or physiologic abnormality. There is a definite tendency for seizures to arise from the bilateral temporal regions. Focal seizures were seen independently from right and left temporal regions, the generalized convulsion captured appears to have left temporal onset. If further clinical questions remain, inpatient video EEG monitoring may be helpful.   Patrcia Dolly, M.D.

## 2022-06-08 ENCOUNTER — Ambulatory Visit: Payer: Self-pay | Admitting: Neurology

## 2022-06-14 ENCOUNTER — Other Ambulatory Visit: Payer: Self-pay

## 2022-06-14 ENCOUNTER — Telehealth: Payer: Self-pay | Admitting: Neurology

## 2022-06-14 DIAGNOSIS — R569 Unspecified convulsions: Secondary | ICD-10-CM

## 2022-06-14 MED ORDER — LAMOTRIGINE 200 MG PO TABS: 200.0000 mg | ORAL_TABLET | Freq: Two times a day (BID) | ORAL | 1 refills | Status: DC

## 2022-06-14 NOTE — Telephone Encounter (Signed)
Prescription sent patients wife called to be made aware

## 2022-06-14 NOTE — Telephone Encounter (Signed)
Pt's wife called in stating she was told to let Dr. Karel Jarvis know when the pt was out of the 100 mg Lamotrigine so Dr. Karel Jarvis can switch him to the 200 mg tablets. Send to [facility].

## 2022-07-02 ENCOUNTER — Telehealth: Payer: Self-pay | Admitting: Neurology

## 2022-07-02 NOTE — Telephone Encounter (Signed)
The following message was left with [employer] on 07/02/22 at 1:02 PM.  Caller states her husband is still having small seizures even after upping his dosage on his medication. Caller states he had 9 small seizures on Wednesday.

## 2022-07-02 NOTE — Telephone Encounter (Signed)
Close encounter 

## 2022-07-06 ENCOUNTER — Other Ambulatory Visit: Payer: Self-pay

## 2022-07-06 DIAGNOSIS — F1994 Other psychoactive substance use, unspecified with psychoactive substance-induced mood disorder: Secondary | ICD-10-CM

## 2022-07-06 NOTE — Telephone Encounter (Signed)
Called and advised, ordered level

## 2022-07-06 NOTE — Telephone Encounter (Signed)
Pls have them check Lamictal level so we can see how much is in his system, thanks

## 2022-07-06 NOTE — Telephone Encounter (Signed)
All back to normal, except today, small seizure this am.  Xanax 0.5mg  qday, when she didn't give it to him. But had a seizure this am.  Doesn't think it is withdrawal  No sleep deprived No alcohol intake Always new stress Baseline back to himself Meds: Lamotgrine 200mg  bid            Celexa 20mg  1 tab poqd.

## 2023-02-21 IMAGING — MR MR HEAD W/O CM
9 of 10 series · 39 of 48 positions shown · non-contrast
Comparison: Noncontrast head CT 12/02/2021.

CLINICAL DATA: New onset seizure. Additional history provided by
scanning technologist: Patient reports history of possible seizures
for 15 years.

EXAM:
MRI HEAD WITHOUT CONTRAST
TECHNIQUE: Multiplanar, multiecho pulse sequences of the brain and surrounding
structures were obtained without intravenous contrast.

[Series 2: T1 · sagittal · 5.0mm · 0.45mm/px · 2 of 27 slices shown]
[im 1/27]
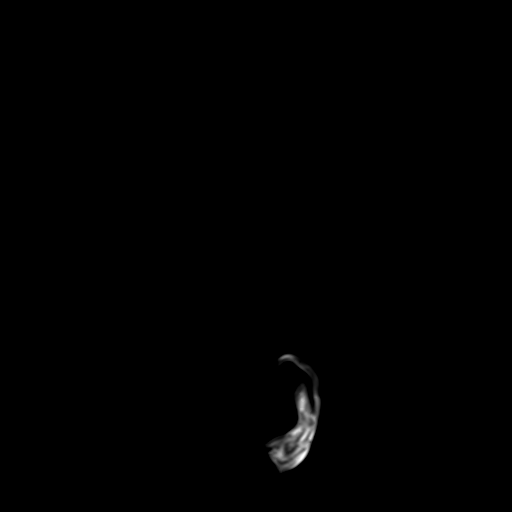
[im 27/27]
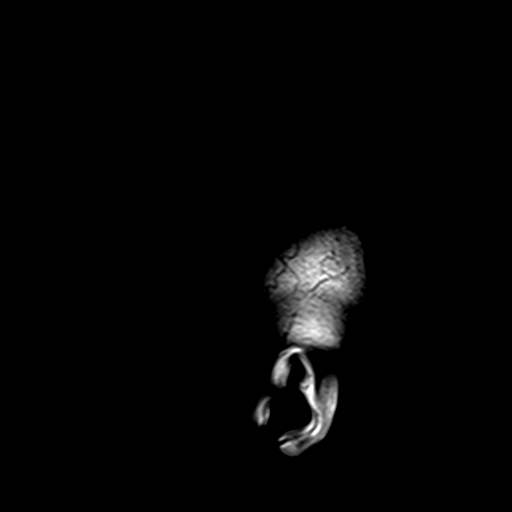

[Series 3: DWI · axial · 3.0mm · 1.80mm/px · z∈[-49,+106]mm · 11 of 105 slices shown (1 of 2)]
[im 1/105]
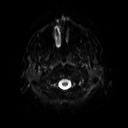
[im 11/105]
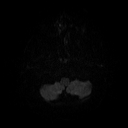
[im 21/105]
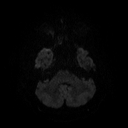
[im 32/105]
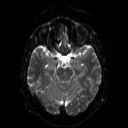
[im 42/105]
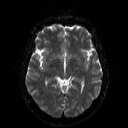
[im 53/105]
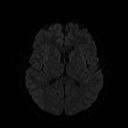
[im 63/105]
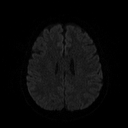
[im 73/105]
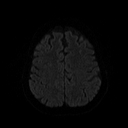
[im 84/105]
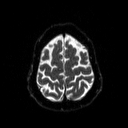
[im 94/105]
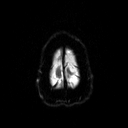
[im 105/105]
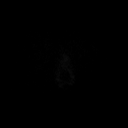

[Series 4: DWI · axial · 3.0mm · 1.80mm/px · z∈[-49,+106]mm · 6 of 53 slices shown (2 of 2)]
[im 1/53]
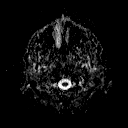
[im 11/53]
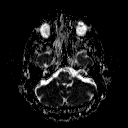
[im 21/53]
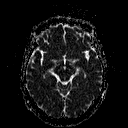
[im 32/53]
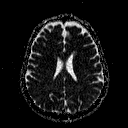
[im 42/53]
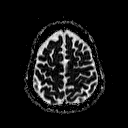
[im 53/53]
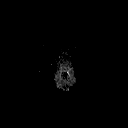

[Series 5: T2 · axial · 5.0mm · 0.51mm/px · z∈[-52,+107]mm · 3 of 25 slices shown (1 of 3)]
[im 1/25]
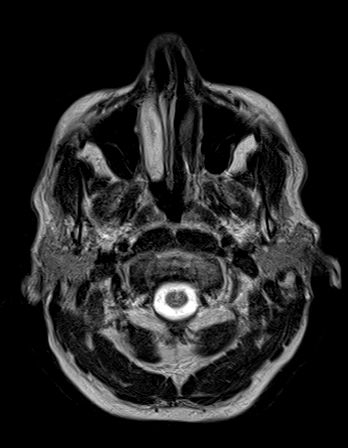
[im 13/25]
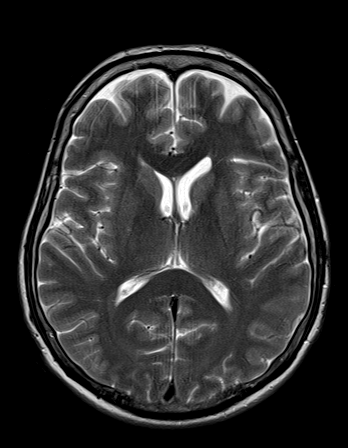
[im 25/25]
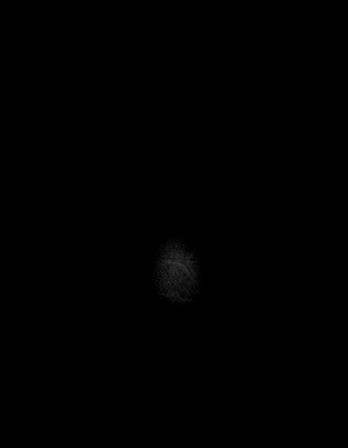

[Series 6: FLAIR · axial · 3.0mm · 0.47mm/px · z∈[-66,+96]mm · 4 of 36 slices shown (1 of 2)]
[im 1/36]
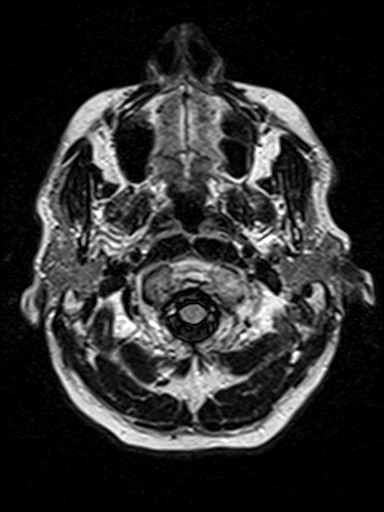
[im 12/36]
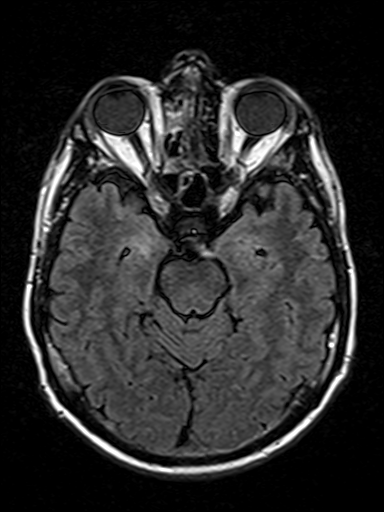
[im 24/36]
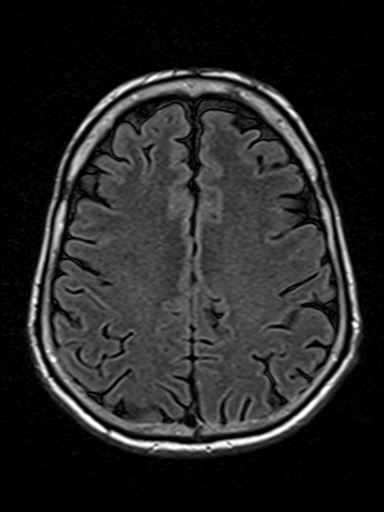
[im 36/36]
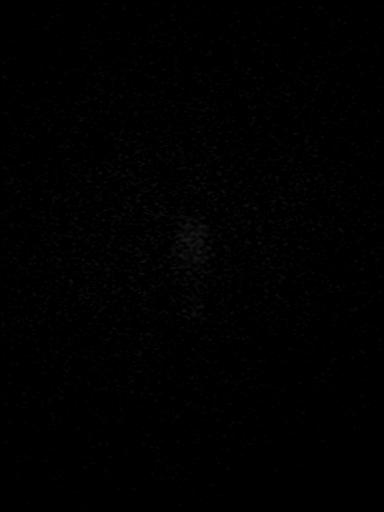

[Series 8: swi_images · axial · 4.0mm · 0.90mm/px · z∈[-63,+92]mm · 4 of 40 slices shown]
[im 1/40]
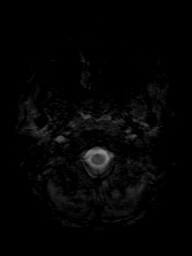
[im 14/40]
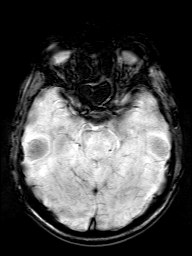
[im 27/40]
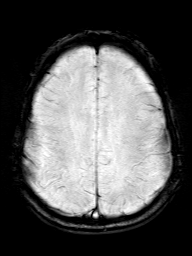
[im 40/40]
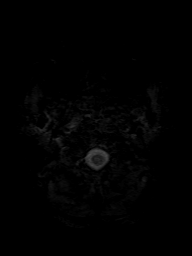

[Series 10: T2 · coronal · 3.0mm · 0.23mm/px · 3 of 28 slices shown (2 of 3)]
[im 1/28]
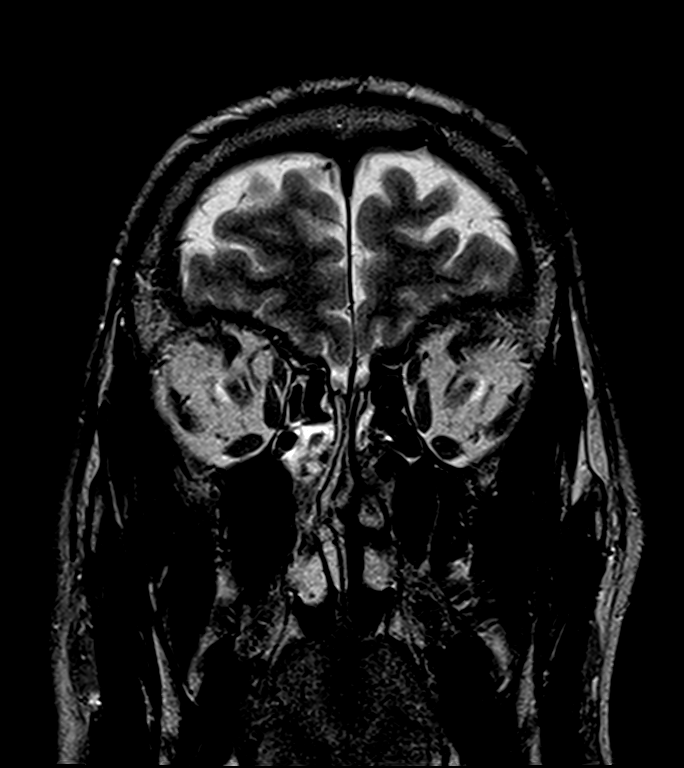
[im 14/28]
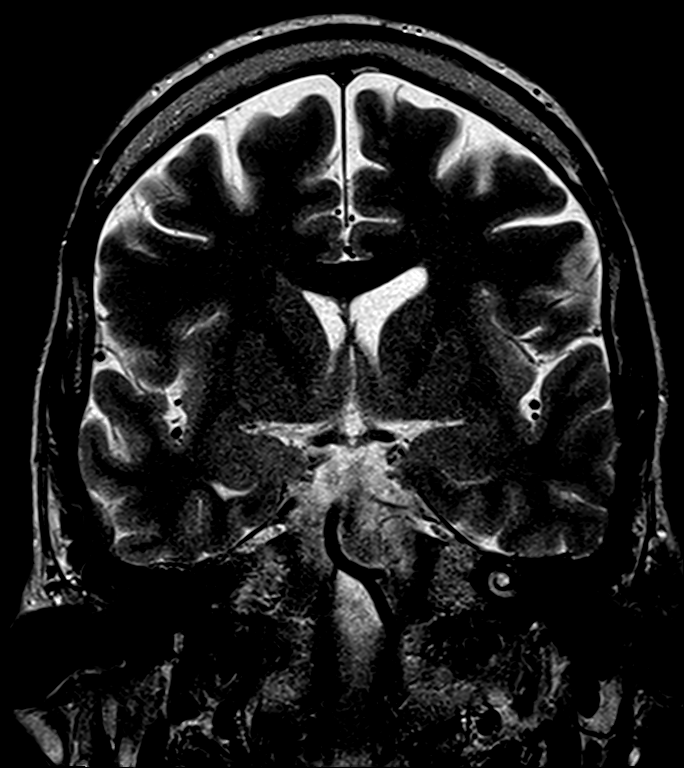
[im 28/28]
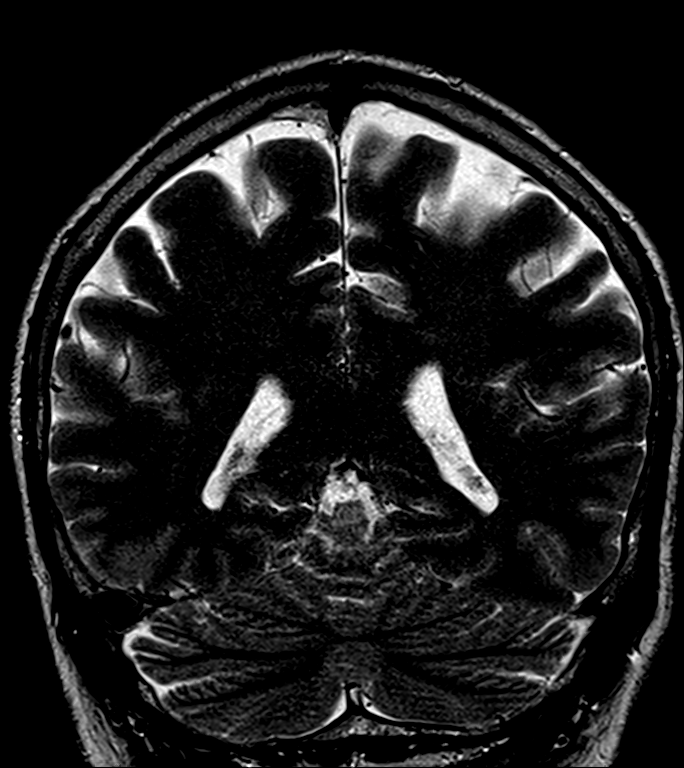

[Series 11: FLAIR · coronal · 3.0mm · 0.70mm/px · 3 of 28 slices shown (2 of 2)]
[im 1/28]
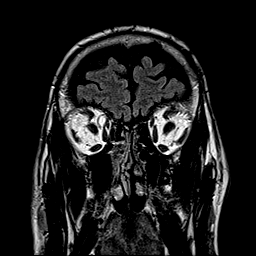
[im 14/28]
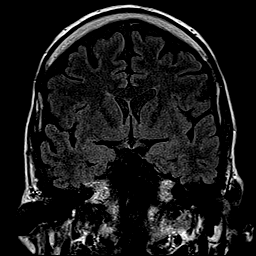
[im 28/28]
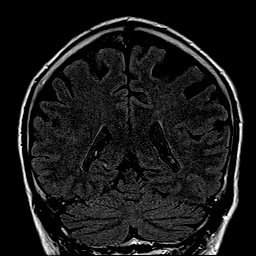

[Series 12: T2 · coronal · 5.0mm · 0.45mm/px · 3 of 31 slices shown (3 of 3)]
[im 1/31]
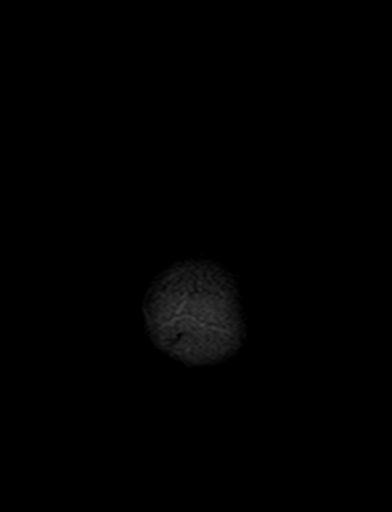
[im 16/31]
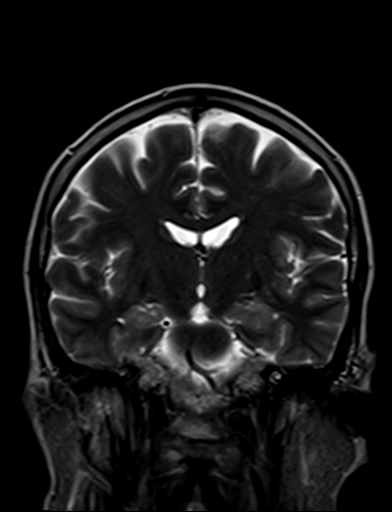
[im 31/31]
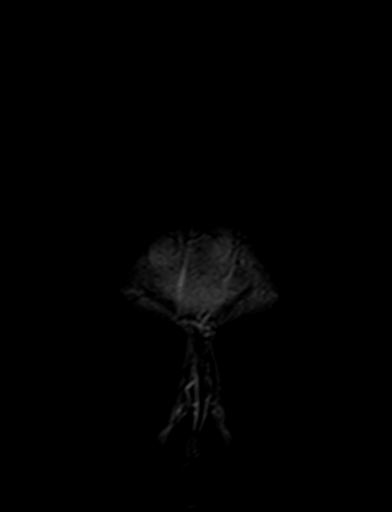

[39 of 48 positions shown; findings below may reference images not displayed]

FINDINGS: Due to difficulty obtaining secure IV access, post-contrast imaging
could not be acquired. A non-contrast study was performed.

Mild intermittent motion degradation.

Brain:

No age advanced or lobar predominant atrophy.

There are a few small nonspecific T2 FLAIR hyperintense remote
insults within the bilateral frontal lobe white matter (measuring up
to 4 mm).

No cortical encephalomalacia is identified.

The hippocampi are symmetric in size and signal.

There is no acute infarct.

No evidence of an intracranial mass.

No chronic intracranial blood products.

No extra-axial fluid collection.

No midline shift.

Vascular: Maintained flow voids within the proximal large arterial
vessels.

Skull and upper cervical spine: No focal suspicious marrow lesion.

Sinuses/Orbits: Visualized orbits show no acute finding. Mild
mucosal thickening within the right greater than left ethmoid and
bilateral sphenoid sinuses.
IMPRESSION: 1. Due to difficulty obtaining secure IV access, post-contrast
imaging could not be acquired. A non-contrast study was performed.
2. Mildly motion degraded exam.
3. No evidence of acute intracranial abnormality.
4. No specific seizure focus is identified.
5. There are a few small nonspecific T2 FLAIR hyperintense remote
insults within the bilateral frontal lobe white matter (measuring up
to 4 mm).
6. Otherwise unremarkable non-contrast MRI appearance of the brain
7. Mild paranasal sinus mucosal thickening, as described.

## 2023-06-08 ENCOUNTER — Ambulatory Visit: Payer: Self-pay | Admitting: Neurology

## 2023-08-31 ENCOUNTER — Ambulatory Visit (INDEPENDENT_AMBULATORY_CARE_PROVIDER_SITE_OTHER): Payer: Self-pay | Admitting: Neurology

## 2023-08-31 ENCOUNTER — Other Ambulatory Visit (INDEPENDENT_AMBULATORY_CARE_PROVIDER_SITE_OTHER): Payer: Self-pay

## 2023-08-31 ENCOUNTER — Encounter: Payer: Self-pay | Admitting: Neurology

## 2023-08-31 DIAGNOSIS — R569 Unspecified convulsions: Secondary | ICD-10-CM

## 2023-08-31 MED ORDER — LAMOTRIGINE 200 MG PO TABS: ORAL_TABLET | ORAL | 3 refills | Status: DC

## 2023-08-31 NOTE — Patient Instructions (Signed)
Good to see you.  Have bloodwork for Lamotrigine level done  2. Increase Lamotrigine 200mg : take 1 and 1/2 tablets twice a day  3. Follow-up in 4 months, call for any changes   Seizure Precautions: 1. If medication has been prescribed for you to prevent seizures, take it exactly as directed.  Do not stop taking the medicine without talking to your doctor first, even if you have not had a seizure in a long time.   2. Avoid activities in which a seizure would cause danger to yourself or to others.  Don't operate dangerous machinery, swim alone, or climb in high or dangerous places, such as on ladders, roofs, or girders.  Do not drive unless your doctor says you may.  3. If you have any warning that you may have a seizure, lay down in a safe place where you can't hurt yourself.    4.  No driving for 6 months from last seizure, as per [facility].   Please refer to the following link on the [employer]'s website for more information: http://www.epilepsyfoundation.org/answerplace/Social/driving/drivingu.cfm   5.  Maintain good sleep hygiene.  6.  Contact your doctor if you have any problems that may be related to the medicine you are taking.  7.  Call 911 and bring the patient back to the ED if:        A.  The seizure lasts longer than 5 minutes.       B.  The patient doesn't awaken shortly after the seizure  C.  The patient has new problems such as difficulty seeing, speaking or moving  D.  The patient was injured during the seizure  E.  The patient has a temperature over 102 F (39C)  F.  The patient vomited and now is having trouble breathing
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
## 2023-09-03 LAB — LAMOTRIGINE LEVEL: Lamotrigine Lvl: 10.5 ug/mL (ref 2.5–15.0)

## 2023-09-05 ENCOUNTER — Encounter: Payer: Self-pay | Admitting: Neurology

## 2023-10-05 ENCOUNTER — Telehealth: Payer: Self-pay | Admitting: Neurology

## 2023-10-05 NOTE — Telephone Encounter (Signed)
That is really up to his doctor who prescribes the Xanax if they are comfortable with it due to potential for dependence. He is under a lot of stress, his doctor knows this and can adjust his meds as they feel appropriate. Thanks

## 2023-10-05 NOTE — Telephone Encounter (Signed)
Patient wants to know if Todd Lopez wants his alprazolam upped since he is having more seizures. He needs to let the doctor that prescribed them know. He is taking 0.5MG 

## 2023-10-05 NOTE — Telephone Encounter (Signed)
Pt has not had any resent seizures they are just asking if they can increase from a half a tablet to a full tablet  to keep him calm to help prevent  a seizure, they stated that he is on a good dose of lamotrigine,

## 2023-10-06 NOTE — Telephone Encounter (Signed)
Spoke with pt wife informed her that Dr Karel Jarvis stated it is really up to his doctor who prescribes the Xanax if they are comfortable with it due to potential for dependence. He is under a lot of stress, his doctor knows this and can adjust his meds as they feel appropriate.

## 2023-11-28 ENCOUNTER — Emergency Department (HOSPITAL_COMMUNITY): Payer: Self-pay

## 2023-11-28 ENCOUNTER — Emergency Department (HOSPITAL_COMMUNITY)
Admission: EM | Admit: 2023-11-28 | Discharge: 2023-11-28 | Disposition: A | Discharge status: Home / Self Care | Payer: Self-pay

## 2023-11-28 DIAGNOSIS — Y9339 Activity, other involving climbing, rappelling and jumping off: Secondary | ICD-10-CM | POA: Insufficient documentation

## 2023-11-28 DIAGNOSIS — W11XXXA Fall on and from ladder, initial encounter: Secondary | ICD-10-CM | POA: Insufficient documentation

## 2023-11-28 DIAGNOSIS — M542 Cervicalgia: Secondary | ICD-10-CM | POA: Insufficient documentation

## 2023-11-28 DIAGNOSIS — Z23 Encounter for immunization: Secondary | ICD-10-CM | POA: Insufficient documentation

## 2023-11-28 DIAGNOSIS — S52592A Other fractures of lower end of left radius, initial encounter for closed fracture: Secondary | ICD-10-CM | POA: Insufficient documentation

## 2023-11-28 DIAGNOSIS — S52502A Unspecified fracture of the lower end of left radius, initial encounter for closed fracture: Secondary | ICD-10-CM

## 2023-11-28 DIAGNOSIS — S01112A Laceration without foreign body of left eyelid and periocular area, initial encounter: Secondary | ICD-10-CM | POA: Insufficient documentation

## 2023-11-28 LAB — I-STAT CHEM 8, ED
BUN: 19 mg/dL (ref 6–20)
BUN: 21 mg/dL — ABNORMAL HIGH (ref 6–20)
Calcium, Ion: 1.07 mmol/L — ABNORMAL LOW (ref 1.15–1.40)
Calcium, Ion: 1.13 mmol/L — ABNORMAL LOW (ref 1.15–1.40)
Chloride: 103 mmol/L (ref 98–111)
Chloride: 103 mmol/L (ref 98–111)
Creatinine, Ser: 1.1 mg/dL (ref 0.61–1.24)
Creatinine, Ser: 1.2 mg/dL (ref 0.61–1.24)
Glucose, Bld: 104 mg/dL — ABNORMAL HIGH (ref 70–99)
Glucose, Bld: 99 mg/dL (ref 70–99)
HCT: 52 % (ref 39.0–52.0)
HCT: 52 % (ref 39.0–52.0)
Hemoglobin: 17.7 g/dL — ABNORMAL HIGH (ref 13.0–17.0)
Hemoglobin: 17.7 g/dL — ABNORMAL HIGH (ref 13.0–17.0)
Potassium: 4.4 mmol/L (ref 3.5–5.1)
Potassium: 4.5 mmol/L (ref 3.5–5.1)
Sodium: 138 mmol/L (ref 135–145)
Sodium: 139 mmol/L (ref 135–145)
TCO2: 28 mmol/L (ref 22–32)
TCO2: 29 mmol/L (ref 22–32)

## 2023-11-28 LAB — CBC WITH DIFFERENTIAL/PLATELET
Abs Immature Granulocytes: 0.03 10*3/uL (ref 0.00–0.07)
Basophils Absolute: 0.1 10*3/uL (ref 0.0–0.1)
Basophils Relative: 1 %
Eosinophils Absolute: 0.1 10*3/uL (ref 0.0–0.5)
Eosinophils Relative: 1 %
HCT: 51.6 % (ref 39.0–52.0)
Hemoglobin: 17.5 g/dL — ABNORMAL HIGH (ref 13.0–17.0)
Immature Granulocytes: 0 %
Lymphocytes Relative: 21 %
Lymphs Abs: 2.1 10*3/uL (ref 0.7–4.0)
MCH: 33.1 pg (ref 26.0–34.0)
MCHC: 33.9 g/dL (ref 30.0–36.0)
MCV: 97.5 fL (ref 80.0–100.0)
Monocytes Absolute: 0.4 10*3/uL (ref 0.1–1.0)
Monocytes Relative: 4 %
Neutro Abs: 7.5 10*3/uL (ref 1.7–7.7)
Neutrophils Relative %: 73 %
Platelets: 242 10*3/uL (ref 150–400)
RBC: 5.29 MIL/uL (ref 4.22–5.81)
RDW: 12.2 % (ref 11.5–15.5)
WBC: 10.2 10*3/uL (ref 4.0–10.5)
nRBC: 0 % (ref 0.0–0.2)

## 2023-11-28 LAB — COMPREHENSIVE METABOLIC PANEL
ALT: 58 U/L — ABNORMAL HIGH (ref 0–44)
AST: 58 U/L — ABNORMAL HIGH (ref 15–41)
Albumin: 4.2 g/dL (ref 3.5–5.0)
Alkaline Phosphatase: 67 U/L (ref 38–126)
Anion gap: 13 (ref 5–15)
BUN: 14 mg/dL (ref 6–20)
CO2: 22 mmol/L (ref 22–32)
Calcium: 9.5 mg/dL (ref 8.9–10.3)
Chloride: 102 mmol/L (ref 98–111)
Creatinine, Ser: 1.12 mg/dL (ref 0.61–1.24)
GFR, Estimated: >60 mL/min (ref >60)
Glucose, Bld: 105 mg/dL — ABNORMAL HIGH (ref 70–99)
Potassium: 4.2 mmol/L (ref 3.5–5.1)
Sodium: 137 mmol/L (ref 135–145)
Total Bilirubin: 1.2 mg/dL (ref 0.0–1.2)
Total Protein: 7.2 g/dL (ref 6.5–8.1)

## 2023-11-28 LAB — CK TOTAL AND CKMB (NOT AT ARMC)
CK, MB: 3.5 ng/mL (ref 0.5–5.0)
Total CK: 104 U/L (ref 49–397)

## 2023-11-28 LAB — TYPE AND SCREEN
ABO/RH(D): A POS
Antibody Screen: NEGATIVE

## 2023-11-28 LAB — APTT: aPTT: 28 s (ref 24–36)

## 2023-11-28 LAB — ABO/RH: ABO/RH(D): A POS

## 2023-11-28 MED ORDER — TETANUS-DIPHTH-ACELL PERTUSSIS 5-2.5-18.5 LF-MCG/0.5 IM SUSY
0.5000 mL | PREFILLED_SYRINGE | Freq: Once | INTRAMUSCULAR | Status: AC
  Administered 2023-11-28: 0.5 mL via INTRAMUSCULAR
  Filled 2023-11-28: qty 0.5

## 2023-11-28 MED ORDER — MORPHINE SULFATE (PF) 4 MG/ML IV SOLN
4.0000 mg | Freq: Once | INTRAVENOUS | Status: AC
  Administered 2023-11-28: 4 mg via INTRAVENOUS
  Filled 2023-11-28: qty 1

## 2023-11-28 MED ORDER — ONDANSETRON HCL 4 MG/2ML IJ SOLN
4.0000 mg | Freq: Once | INTRAMUSCULAR | Status: AC
  Administered 2023-11-28: 4 mg via INTRAVENOUS
  Filled 2023-11-28: qty 2

## 2023-11-28 MED ORDER — FENTANYL CITRATE PF 50 MCG/ML IJ SOSY
50.0000 ug | PREFILLED_SYRINGE | Freq: Once | INTRAMUSCULAR | Status: AC
  Administered 2023-11-28: 50 ug via INTRAVENOUS
  Filled 2023-11-28: qty 1

## 2023-11-28 MED ORDER — LIDOCAINE-EPINEPHRINE (PF) 2 %-1:200000 IJ SOLN
10.0000 mL | Freq: Once | INTRAMUSCULAR | Status: AC
  Administered 2023-11-28: 10 mL
  Filled 2023-11-28: qty 20

## 2023-11-28 MED ORDER — METHOCARBAMOL 500 MG PO TABS: 500.0000 mg | ORAL_TABLET | Freq: Two times a day (BID) | ORAL | 0 refills | Status: AC

## 2023-11-28 MED ORDER — IOHEXOL 350 MG/ML SOLN
75.0000 mL | Freq: Once | INTRAVENOUS | Status: AC | PRN
  Administered 2023-11-28: 75 mL via INTRAVENOUS

## 2023-11-28 MED ORDER — HYDROCODONE-ACETAMINOPHEN 5-325 MG PO TABS: 1.0000 | ORAL_TABLET | Freq: Four times a day (QID) | ORAL | 0 refills | Status: AC | PRN

## 2023-11-28 NOTE — Discharge Instructions (Addendum)
Please use Tylenol or ibuprofen for pain.  You may use 600 mg ibuprofen every 6 hours or 1000 mg of Tylenol every 6 hours.  You may choose to alternate between the 2.  This would be most effective.  Not to exceed 4 g of Tylenol within 24 hours.  Not to exceed 3200 mg ibuprofen 24 hours.  You can use the muscle relaxant I am prescribing in addition to the above to help with any breakthrough pain.  You can take it up to twice daily.  It is safe to take at night, but I would be cautious taking it during the day as it can cause some drowsiness.  Make sure that you are feeling awake and alert before you get behind the wheel of a car or operate a motor vehicle.  It is not a narcotic pain medication so you are able to take it if it is not making you drowsy and still pilot a vehicle or machinery safely.  You can use the stronger pain medication in place of Tylenol above for severe breakthrough pain.  I recommend that you take something for constipation such as MiraLAX or Colace daily if you are going to take the narcotic pain medication.  Please keep your splint clean and dry, and follow up in around 1 week with the orthopedic physician whose contact information I provided above.
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
## 2023-11-28 NOTE — Progress Notes (Signed)
   11/28/23 1252  Spiritual Encounters  Type of Visit Initial  Care provided to: Pt and family  Conversation partners present during encounter Nurse  Referral source Trauma page  Reason for visit Trauma   Chaplaina responding to Trauma 2 page.  Arrived to find Pt receiving care and wife was present as support person.  No spiritual services needed at this time.  Chaplain services remain available by Spiritual Consult or for emergent cases, paging [phone]  Chaplain Raelene Bott, MDiv [email or web address] [phone]
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
## 2023-11-28 NOTE — ED Notes (Signed)
EDP at bedside

## 2023-11-28 NOTE — ED Notes (Signed)
C-collar applied

## 2023-11-28 NOTE — ED Notes (Signed)
EDP and TRRN at bedside, placing [place] IV

## 2023-11-28 NOTE — ED Provider Notes (Signed)
[place] EMERGENCY DEPARTMENT AT [facility] Provider Note   CSN: [id number] Arrival date & time: 11/28/23  1135     History  No chief complaint on file.   Todd Lopez is a 53 y.o. male with past medical history of seizures is presenting to the emergency room after fall.  Patient reports he was climbing from a ladder when he slipped and fell from approximately 10 feet landing on his left wrist and anterior aspect of left forehead.  Patient reports he had loss of consciousness.  Patient is not on blood thinners.  He now reports mild headache.  He is primarily complaining of headache, neck pain, left wrist pain and left forearm pain.  He denies any significant chest pain or shortness of breath. Denies radicular symptoms or focal weakness. No change in vision.   HPI     Home Medications Prior to Admission medications   Medication Sig Start Date End Date Taking? Authorizing Provider  acetaminophen (TYLENOL) 500 MG tablet Take 1,000 mg by mouth every 6 (six) hours as needed for headache.    [provider]  ALPRAZolam Prudy Feeler) 0.5 MG tablet as needed. 04/21/22   [provider]  citalopram (CELEXA) 40 MG tablet Take 40 mg by mouth daily. 05/30/23   [provider]  ibuprofen (ADVIL) 200 MG tablet Take 400-600 mg by mouth every 6 (six) hours as needed.    [provider]  lamoTRIgine (LAMICTAL) 200 MG tablet Take 1 and 1/2 tablets twice a day 08/31/23   Van Clines, MD  tamsulosin (FLOMAX) 0.4 MG CAPS capsule Take 0.4 mg by mouth daily. 05/30/23   [provider]      Allergies    Patient has no known allergies.    Review of Systems   Review of Systems  Neurological:  Positive for headaches.    Physical Exam Updated Vital Signs BP 112/60   Pulse 62   Temp 98.1 F (36.7 C) (Oral)   Resp 20   SpO2 94%  Physical Exam Vitals and nursing note reviewed.  Constitutional:      General: He is not in acute distress.     Appearance: He is not toxic-appearing.  HENT:     Head: Normocephalic and atraumatic.  Eyes:     General: No scleral icterus.    Conjunctiva/sclera: Conjunctivae normal.  Cardiovascular:     Rate and Rhythm: Normal rate and regular rhythm.     Pulses: Normal pulses.     Heart sounds: Normal heart sounds.  Pulmonary:     Effort: Pulmonary effort is normal. No respiratory distress.     Breath sounds: Normal breath sounds.  Abdominal:     General: Abdomen is flat. Bowel sounds are normal.     Palpations: Abdomen is soft.     Tenderness: There is no abdominal tenderness.  Musculoskeletal:     Right lower leg: No edema.     Left lower leg: No edema.  Skin:    General: Skin is warm and dry.     Findings: No lesion.  Neurological:     General: No focal deficit present.     Mental Status: He is alert and oriented to person, place, and time. Mental status is at baseline.     ED Results / Procedures / Treatments   Labs (all labs ordered are listed, but only abnormal results are displayed) Labs Reviewed  CBC WITH DIFFERENTIAL/PLATELET - Abnormal; Notable for the following components:  Result Value   Hemoglobin 17.5 (*)    All other components within normal limits  I-STAT CHEM 8, ED - Abnormal; Notable for the following components:   BUN 21 (*)    Glucose, Bld 104 (*)    Calcium, Ion 1.07 (*)    Hemoglobin 17.7 (*)    All other components within normal limits  APTT  CK TOTAL AND CKMB (NOT AT [facility])  COMPREHENSIVE METABOLIC PANEL  TYPE AND SCREEN    EKG None  Radiology CT Head Wo Contrast Result Date: 11/28/2023 CLINICAL DATA:  Fall from ladder, head, neck, and facial trauma EXAM: CT HEAD WITHOUT CONTRAST CT MAXILLOFACIAL WITHOUT CONTRAST CT CERVICAL SPINE WITHOUT CONTRAST TECHNIQUE: Multidetector CT imaging of the head, cervical spine, and maxillofacial structures were performed using the standard protocol without intravenous contrast. Multiplanar CT image reconstructions  of the cervical spine and maxillofacial structures were also generated. RADIATION DOSE REDUCTION: This exam was performed according to the departmental dose-optimization program which includes automated exposure control, adjustment of the mA and/or kV according to patient size and/or use of iterative reconstruction technique. COMPARISON:  12/02/2021 CT head and cervical spine, no prior CT maxillofacial FINDINGS: CT HEAD FINDINGS Brain: No evidence of acute infarct, hemorrhage, mass, mass effect, or midline shift. No hydrocephalus or extra-axial fluid collection. Vascular: No hyperdense vessel. Skull: Negative for fracture or focal lesion. CT MAXILLOFACIAL FINDINGS Osseous: No fracture or mandibular dislocation. No destructive process. Orbits: No traumatic or inflammatory finding. Sinuses: Minimal mucosal thickening in the ethmoid air cells. Otherwise clear paranasal sinuses and mastoid air cells Soft tissues: Left periorbital hematoma. CT CERVICAL SPINE FINDINGS Alignment: No listhesis. Skull base and vertebrae: No acute fracture. No primary bone lesion or focal pathologic process. Soft tissues and spinal canal: No prevertebral fluid or swelling. No visible canal hematoma. Disc levels: Mild degenerative changes in the cervical spine. No high-grade spinal canal stenosis. Upper chest: For findings in the thorax, please see same day CT chest. IMPRESSION: 1. No acute intracranial process. 2. No acute facial bone fracture. Left periorbital hematoma. 3. No acute fracture or traumatic listhesis in the cervical spine. Electronically Signed   By: Wiliam Ke M.D.   On: 11/28/2023 15:06   CT Cervical Spine Wo Contrast Result Date: 11/28/2023 CLINICAL DATA:  Fall from ladder, head, neck, and facial trauma EXAM: CT HEAD WITHOUT CONTRAST CT MAXILLOFACIAL WITHOUT CONTRAST CT CERVICAL SPINE WITHOUT CONTRAST TECHNIQUE: Multidetector CT imaging of the head, cervical spine, and maxillofacial structures were performed using the  standard protocol without intravenous contrast. Multiplanar CT image reconstructions of the cervical spine and maxillofacial structures were also generated. RADIATION DOSE REDUCTION: This exam was performed according to the departmental dose-optimization program which includes automated exposure control, adjustment of the mA and/or kV according to patient size and/or use of iterative reconstruction technique. COMPARISON:  12/02/2021 CT head and cervical spine, no prior CT maxillofacial FINDINGS: CT HEAD FINDINGS Brain: No evidence of acute infarct, hemorrhage, mass, mass effect, or midline shift. No hydrocephalus or extra-axial fluid collection. Vascular: No hyperdense vessel. Skull: Negative for fracture or focal lesion. CT MAXILLOFACIAL FINDINGS Osseous: No fracture or mandibular dislocation. No destructive process. Orbits: No traumatic or inflammatory finding. Sinuses: Minimal mucosal thickening in the ethmoid air cells. Otherwise clear paranasal sinuses and mastoid air cells Soft tissues: Left periorbital hematoma. CT CERVICAL SPINE FINDINGS Alignment: No listhesis. Skull base and vertebrae: No acute fracture. No primary bone lesion or focal pathologic process. Soft tissues and spinal canal: No prevertebral fluid or  swelling. No visible canal hematoma. Disc levels: Mild degenerative changes in the cervical spine. No high-grade spinal canal stenosis. Upper chest: For findings in the thorax, please see same day CT chest. IMPRESSION: 1. No acute intracranial process. 2. No acute facial bone fracture. Left periorbital hematoma. 3. No acute fracture or traumatic listhesis in the cervical spine. Electronically Signed   By: Wiliam Ke M.D.   On: 11/28/2023 15:06   CT Maxillofacial WO CM Result Date: 11/28/2023 CLINICAL DATA:  Fall from ladder, head, neck, and facial trauma EXAM: CT HEAD WITHOUT CONTRAST CT MAXILLOFACIAL WITHOUT CONTRAST CT CERVICAL SPINE WITHOUT CONTRAST TECHNIQUE: Multidetector CT imaging of the  head, cervical spine, and maxillofacial structures were performed using the standard protocol without intravenous contrast. Multiplanar CT image reconstructions of the cervical spine and maxillofacial structures were also generated. RADIATION DOSE REDUCTION: This exam was performed according to the departmental dose-optimization program which includes automated exposure control, adjustment of the mA and/or kV according to patient size and/or use of iterative reconstruction technique. COMPARISON:  12/02/2021 CT head and cervical spine, no prior CT maxillofacial FINDINGS: CT HEAD FINDINGS Brain: No evidence of acute infarct, hemorrhage, mass, mass effect, or midline shift. No hydrocephalus or extra-axial fluid collection. Vascular: No hyperdense vessel. Skull: Negative for fracture or focal lesion. CT MAXILLOFACIAL FINDINGS Osseous: No fracture or mandibular dislocation. No destructive process. Orbits: No traumatic or inflammatory finding. Sinuses: Minimal mucosal thickening in the ethmoid air cells. Otherwise clear paranasal sinuses and mastoid air cells Soft tissues: Left periorbital hematoma. CT CERVICAL SPINE FINDINGS Alignment: No listhesis. Skull base and vertebrae: No acute fracture. No primary bone lesion or focal pathologic process. Soft tissues and spinal canal: No prevertebral fluid or swelling. No visible canal hematoma. Disc levels: Mild degenerative changes in the cervical spine. No high-grade spinal canal stenosis. Upper chest: For findings in the thorax, please see same day CT chest. IMPRESSION: 1. No acute intracranial process. 2. No acute facial bone fracture. Left periorbital hematoma. 3. No acute fracture or traumatic listhesis in the cervical spine. Electronically Signed   By: Wiliam Ke M.D.   On: 11/28/2023 15:06   CT CHEST ABDOMEN PELVIS W CONTRAST Result Date: 11/28/2023 CLINICAL DATA:  Polytrauma.  Fall from ladder. EXAM: CT CHEST, ABDOMEN, AND PELVIS WITH CONTRAST TECHNIQUE:  Multidetector CT imaging of the chest, abdomen and pelvis was performed following the standard protocol during bolus administration of intravenous contrast. RADIATION DOSE REDUCTION: This exam was performed according to the departmental dose-optimization program which includes automated exposure control, adjustment of the mA and/or kV according to patient size and/or use of iterative reconstruction technique. CONTRAST:  75mL OMNIPAQUE IOHEXOL 350 MG/ML SOLN COMPARISON:  None Available. FINDINGS: CT CHEST FINDINGS Cardiovascular: Normal cardiac size. No pericardial effusion. No aortic aneurysm. Mediastinum/Nodes: Visualized thyroid gland appears grossly unremarkable. No solid / cystic mediastinal masses. The esophagus is nondistended precluding optimal assessment. No axillary, mediastinal or hilar lymphadenopathy by size criteria. Lungs/Pleura: The central tracheo-bronchial tree is patent. There are dependent changes in bilateral lungs. No mass or consolidation. No pleural effusion or pneumothorax. No suspicious lung nodules. Musculoskeletal: The visualized soft tissues of the chest wall are grossly unremarkable. No suspicious osseous lesions. CT ABDOMEN PELVIS FINDINGS Hepatobiliary: The liver is normal in size. Non-cirrhotic configuration. No suspicious mass. No intrahepatic or extrahepatic bile duct dilation. No calcified gallstones. Normal gallbladder wall thickness. No pericholecystic inflammatory changes. Pancreas: Unremarkable. No pancreatic ductal dilatation or surrounding inflammatory changes. Spleen: Within normal limits. No focal lesion. Adrenals/Urinary Tract:  Adrenal glands are unremarkable. No suspicious renal mass. No hydronephrosis. No renal or ureteric calculi. Unremarkable urinary bladder. Stomach/Bowel: No disproportionate dilation of the small or large bowel loops. No evidence of abnormal bowel wall thickening or inflammatory changes. The appendix was not visualized; however there is no acute  inflammatory process in the right lower quadrant. Vascular/Lymphatic: No ascites or pneumoperitoneum. No abdominal or pelvic lymphadenopathy, by size criteria. No aneurysmal dilation of the major abdominal arteries. There are mild peripheral atherosclerotic vascular calcifications of the aorta and its major branches. Reproductive: Normal size prostate. Symmetric seminal vesicles. Other: There is a tiny fat containing umbilical hernia. The soft tissues and abdominal wall are otherwise unremarkable. Musculoskeletal: No suspicious osseous lesions. There are mild multilevel degenerative changes in the visualized spine. IMPRESSION: *No traumatic injury to the chest, abdomen or pelvis. *Multiple other nonacute observations, as described above. Electronically Signed   By: Jules Schick M.D.   On: 11/28/2023 14:53   DG Forearm Left Result Date: 11/28/2023 CLINICAL DATA:  Fall and trauma to the left upper extremity. EXAM: LEFT FOREARM - 2 VIEW; LEFT HAND - COMPLETE 3+ VIEW; LEFT HUMERUS - 2+ VIEW; LEFT WRIST - COMPLETE 3+ VIEW COMPARISON:  None Available. FINDINGS: There is a nondisplaced fracture of the distal radius. No other acute fracture. There is no dislocation. The bones are osteopenic. Evaluation of the phalanges is limited due to flexed positioning. There is a 15 mm linear radiopaque foreign object in the thenar soft tissues of the first webspace. No soft tissue gas. There is soft tissue swelling of the wrist. IMPRESSION: 1. Nondisplaced fracture of the distal radius. 2. Linear radiopaque foreign object in the thenar soft tissues of the first webspace. Electronically Signed   By: Elgie Collard M.D.   On: 11/28/2023 14:50   DG Humerus Left Result Date: 11/28/2023 CLINICAL DATA:  Fall and trauma to the left upper extremity. EXAM: LEFT FOREARM - 2 VIEW; LEFT HAND - COMPLETE 3+ VIEW; LEFT HUMERUS - 2+ VIEW; LEFT WRIST - COMPLETE 3+ VIEW COMPARISON:  None Available. FINDINGS: There is a nondisplaced fracture of  the distal radius. No other acute fracture. There is no dislocation. The bones are osteopenic. Evaluation of the phalanges is limited due to flexed positioning. There is a 15 mm linear radiopaque foreign object in the thenar soft tissues of the first webspace. No soft tissue gas. There is soft tissue swelling of the wrist. IMPRESSION: 1. Nondisplaced fracture of the distal radius. 2. Linear radiopaque foreign object in the thenar soft tissues of the first webspace. Electronically Signed   By: Elgie Collard M.D.   On: 11/28/2023 14:50   DG Wrist Complete Left Result Date: 11/28/2023 CLINICAL DATA:  Fall and trauma to the left upper extremity. EXAM: LEFT FOREARM - 2 VIEW; LEFT HAND - COMPLETE 3+ VIEW; LEFT HUMERUS - 2+ VIEW; LEFT WRIST - COMPLETE 3+ VIEW COMPARISON:  None Available. FINDINGS: There is a nondisplaced fracture of the distal radius. No other acute fracture. There is no dislocation. The bones are osteopenic. Evaluation of the phalanges is limited due to flexed positioning. There is a 15 mm linear radiopaque foreign object in the thenar soft tissues of the first webspace. No soft tissue gas. There is soft tissue swelling of the wrist. IMPRESSION: 1. Nondisplaced fracture of the distal radius. 2. Linear radiopaque foreign object in the thenar soft tissues of the first webspace. Electronically Signed   By: Elgie Collard M.D.   On: 11/28/2023 14:50   DG  Hand Complete Left Result Date: 11/28/2023 CLINICAL DATA:  Fall and trauma to the left upper extremity. EXAM: LEFT FOREARM - 2 VIEW; LEFT HAND - COMPLETE 3+ VIEW; LEFT HUMERUS - 2+ VIEW; LEFT WRIST - COMPLETE 3+ VIEW COMPARISON:  None Available. FINDINGS: There is a nondisplaced fracture of the distal radius. No other acute fracture. There is no dislocation. The bones are osteopenic. Evaluation of the phalanges is limited due to flexed positioning. There is a 15 mm linear radiopaque foreign object in the thenar soft tissues of the first webspace.  No soft tissue gas. There is soft tissue swelling of the wrist. IMPRESSION: 1. Nondisplaced fracture of the distal radius. 2. Linear radiopaque foreign object in the thenar soft tissues of the first webspace. Electronically Signed   By: Elgie Collard M.D.   On: 11/28/2023 14:50   DG Chest 1 View Result Date: 11/28/2023 CLINICAL DATA:  [id number] Trauma [id number] EXAM: CHEST  1 VIEW COMPARISON:  None Available. FINDINGS: The heart size and mediastinal contours are within normal limits by AP projection. Aortic atherosclerosis. Both lungs are clear. No pneumothorax. The visualized skeletal structures are unremarkable. IMPRESSION: No acute findings. Electronically Signed   By: Duanne Guess D.O.   On: 11/28/2023 14:45    Procedures Procedures    Medications Ordered in ED Medications  fentaNYL (SUBLIMAZE) injection 50 mcg (has no administration in time range)  ondansetron (ZOFRAN) injection 4 mg (has no administration in time range)    ED Course/ Medical Decision Making/ A&P                                 Medical Decision Making Amount and/or Complexity of Data Reviewed Labs: ordered. Radiology: ordered.  Risk Prescription drug management.   This patient presents to the ED for concern of fall, this involves an extensive number of treatment options, and is a complaint that carries with it a high risk of complications and morbidity.  The differential diagnosis includes pneumothorax, fractures, hemorrhage, sprain, intracranial abnormality, cervical fracture   Co morbidities that complicate the patient evaluation  Seizure   Lab Tests:  I personally interpreted labs.  The pertinent results include:   CBC without leukocytosis, hemoglobin is 17.5, CK within normal limits, PTT within normal limits CMP with mild elevation in AST and ALT -discussed findings with patient, these findings are consistent with prior recent labs.   Imaging Studies ordered:  I ordered imaging studies including  CT head, cervical spine, maxillofacial without any acute abnormality.  CT chest abdomen and pelvis without any acute findings.  Imaging of the left arm showing left nondisplaced distal radial fracture I independently visualized and interpreted imaging and I agree with the radiologist interpretation   Cardiac Monitoring: / EKG:  The patient was maintained on a cardiac monitor.     Consultations Obtained:  None at this time.    Problem List / ED Course / Critical interventions / Medication management  Patient reported to emergency room after falling off ladder.  He is primarily reporting pain over left eyebrow laceration as well as left distal wrist.  Imaging reassuring without any acute intercranial abnormality, no cervical spine fracture.  CT chest abdomen pelvis without any acute abnormality.  Patient does have chest wall radial fracture.  No sign of open fracture.  I have updated tetanus shot.  Patient will need laceration repair however oncoming ED PA will repair laceration.  Patient is hemodynamically throughout stay.  Did have initial soft blood pressure however repeat vitals show improvement, so I feel it was likely inaccurate.  Patient otherwise hemodynamically stable and not hypoxic and not tachycardic.  With reassuring imaging after laceration repair and casting patient likely stable for discharge and follow-up with orthopedics. I ordered medication including morphine, Zofran Reevaluation of the patient after these medicines showed that the patient improved I have reviewed the patients home medicines and have made adjustments as needed   Plan  Likely stable for discharge.  Pending CT scan of head as a patient overnight.  Patient does have a distal radial fracture.         Final Clinical Impression(s) / ED Diagnoses Final diagnoses:  Closed fracture of distal end of left radius, unspecified fracture morphology, initial encounter    Rx / DC Orders ED Discharge Orders      None         Smitty Knudsen, PA-C 11/28/23 1527    Coral Spikes, DO 11/28/23 1535

## 2023-11-28 NOTE — ED Provider Notes (Signed)
Accepted handoff at shift change from [facility]. Please see prior provider note for more detail.   Briefly: Patient is 53 y.o.   DDX: concern for mechanical, nonsyncopal fall from ladder.  Notable injuries of left forearm fracture, and left eyebrow laceration.  Not up-to-date on tetanus, tetanus updated in the emergency department.  All imaging is reported at time of handoff, nondisplaced left radius fracture noted, will apply splint, no other acute fractures or dislocations noted.  We repaired laceration as described below.  Patient tolerated without difficulty.  .Laceration Repair  Date/Time: 11/28/2023 3:14 PM  Performed by: Olene Floss, PA-C Authorized by: Olene Floss, PA-C   Consent:    Consent obtained:  Verbal   Consent given by:  Patient   Risks, benefits, and alternatives were discussed: yes     Risks discussed:  Infection, pain, poor cosmetic result, tendon damage, vascular damage, poor wound healing, need for additional repair and nerve damage   Alternatives discussed:  No treatment Universal protocol:    Procedure explained and questions answered to patient or proxy's satisfaction: yes     Patient identity confirmed:  Verbally with patient Anesthesia:    Anesthesia method:  Local infiltration   Local anesthetic:  Lidocaine 2% WITH epi Laceration details:    Location:  Face   Face location:  L eyebrow   Length (cm):  6   Depth (mm):  4 Treatment:    Area cleansed with:  Povidone-iodine and soap and water   Amount of cleaning:  Standard Skin repair:    Repair method:  Sutures   Suture size:  4-0 and 5-0   Suture material:  Prolene   Suture technique:  Simple interrupted   Number of sutures:  8 Approximation:    Approximation:  Close Repair type:    Repair type:  Simple Post-procedure details:    Dressing:  Non-adherent dressing   Procedure completion:  Tolerated    Plan: Laceration repaired as described above.  Pain controlled in  the ED.  Discharged with muscle relaxants and short course of narcotic pain medication for fracture.  Patient placed in short arm volar splint for nondisplaced radial fracture.  Encourage close orthopedic follow-up.  Patient understands and agrees to plan.   [place] 11/28/23 1638    Glyn Ade, MD 11/28/23 1940

## 2023-11-28 NOTE — ED Notes (Addendum)
IV attempted x3 by this RN, this RN able to draw trauma labs but unable to place line, x1 with ED PA.

## 2023-11-28 NOTE — ED Triage Notes (Addendum)
Fall from ladder 1 hr ago (51ft ) with positive LOC. Felt a snap in L wrist causing him to fall from ladder and land on L side. L forearm pain, L forehead lac.   Positive LOC. Not on thinners.

## 2023-11-28 NOTE — ED Notes (Signed)
PT transported to CT with [facility]

## 2023-12-26 ENCOUNTER — Encounter: Payer: Self-pay | Admitting: Neurology

## 2023-12-26 ENCOUNTER — Ambulatory Visit: Payer: Self-pay | Admitting: Neurology

## 2023-12-26 DIAGNOSIS — Z029 Encounter for administrative examinations, unspecified: Secondary | ICD-10-CM

## 2024-02-16 ENCOUNTER — Telehealth: Payer: Self-pay | Admitting: Neurology

## 2024-02-16 DIAGNOSIS — Z79899 Other long term (current) drug therapy: Secondary | ICD-10-CM

## 2024-02-16 NOTE — Telephone Encounter (Signed)
 Pt c/o: seizure Missed medications?  No. Sleep deprived?  No. Alcohol intake?  No. Increased stress? Yes.   2 moths ago fall broken arm an had stiches in head no brain bleed, has fear of fall increase falls 9 calls has bruises Dr Mildred All PCP said something about adding Depakote and lowering lamotrigine   Any change in medication color or shape? Yes.   Last 90 supply was blue then  white this one is back blue  Trigger? Stress, said head fells funny wakes up on floor Back to their usual baseline self?  Yes.  . If no, advise go to ER Current medications prescribed by Dr. Ty Gales:  lamoTRIgine (LAMICTAL) 200 MG tablet Take 1 and 1/2 tablets twice a day  citalopram (CELEXA) 40 MG tablet  Take 40 mg by mouth daily.  ALPRAZolam (XANAX) 0.5 MG tablet as needed   Pt wife said the celexa and xanax was to slow his brain down, to help prevent seizures, she said his memory is getting worse

## 2024-02-16 NOTE — Telephone Encounter (Signed)
 Let's start with getting a Lamictal level, thanks

## 2024-02-16 NOTE — Telephone Encounter (Signed)
 Pt's wife called in stating the pt keeps having seizures and falling. She thinks they are getting worse. She says he fell a few months ago and split his head open. She thinks he has been having more of them since then.

## 2024-02-16 NOTE — Telephone Encounter (Signed)
 Pt called no answer left a voice mail to call the office back

## 2024-02-22 NOTE — Telephone Encounter (Signed)
 Pt called an informed Dr Ty Gales would like to check your Lamictal  level if you can come in before you take your morning dose. You will check in at our office [address] then go to [address] to have lab work done.

## 2024-09-12 ENCOUNTER — Ambulatory Visit: Payer: Self-pay | Admitting: Neurology

## 2024-09-13 ENCOUNTER — Other Ambulatory Visit: Payer: Self-pay | Admitting: Neurology

## 2024-09-13 DIAGNOSIS — R569 Unspecified convulsions: Secondary | ICD-10-CM
